# Patient Record
Sex: Male | Born: 1945 | Race: White | Hispanic: No | Smoking: Never smoker
Health system: Southern US, Community
[De-identification: ages and names within clinical notes are randomized; demographics above are authoritative.]

## PROBLEM LIST (undated history)

## (undated) DIAGNOSIS — G4730 Sleep apnea, unspecified: Secondary | ICD-10-CM

## (undated) DIAGNOSIS — N400 Enlarged prostate without lower urinary tract symptoms: Secondary | ICD-10-CM

## (undated) DIAGNOSIS — I10 Essential (primary) hypertension: Secondary | ICD-10-CM

## (undated) DIAGNOSIS — M1990 Unspecified osteoarthritis, unspecified site: Secondary | ICD-10-CM

## (undated) DIAGNOSIS — K219 Gastro-esophageal reflux disease without esophagitis: Secondary | ICD-10-CM

## (undated) HISTORY — PX: COLONOSCOPY: SHX174

## (undated) HISTORY — DX: Gastro-esophageal reflux disease without esophagitis: K21.9

## (undated) HISTORY — DX: Sleep apnea, unspecified: G47.30

## (undated) HISTORY — PX: KNEE ARTHROSCOPY: SUR90

---

## 1999-10-01 ENCOUNTER — Ambulatory Visit (HOSPITAL_COMMUNITY): Admission: RE | Admit: 1999-10-01 | Discharge: 1999-10-01 | Payer: Self-pay | Admitting: Orthopaedic Surgery

## 2002-03-01 ENCOUNTER — Encounter: Payer: Self-pay | Admitting: Gastroenterology

## 2004-02-13 ENCOUNTER — Encounter: Payer: Self-pay | Admitting: Internal Medicine

## 2005-01-02 ENCOUNTER — Ambulatory Visit: Payer: Self-pay | Admitting: Internal Medicine

## 2005-01-03 ENCOUNTER — Ambulatory Visit: Payer: Self-pay | Admitting: Internal Medicine

## 2006-01-24 ENCOUNTER — Ambulatory Visit: Payer: Self-pay | Admitting: Internal Medicine

## 2006-02-13 ENCOUNTER — Ambulatory Visit: Payer: Self-pay | Admitting: Internal Medicine

## 2007-04-02 ENCOUNTER — Ambulatory Visit: Payer: Self-pay | Admitting: Family Medicine

## 2007-10-12 ENCOUNTER — Ambulatory Visit: Payer: Self-pay | Admitting: Internal Medicine

## 2007-10-12 LAB — CONVERTED CEMR LAB
ALT: 34 units/L (ref 0–53)
AST: 26 units/L (ref 0–37)
Albumin: 4 g/dL (ref 3.5–5.2)
Alkaline Phosphatase: 43 units/L (ref 39–117)
BUN: 22 mg/dL (ref 6–23)
Basophils Absolute: 0 10*3/uL (ref 0.0–0.1)
Basophils Relative: 0.7 % (ref 0.0–1.0)
Bilirubin Urine: NEGATIVE
Bilirubin, Direct: 0.1 mg/dL (ref 0.0–0.3)
CO2: 31 meq/L (ref 19–32)
Calcium: 9.8 mg/dL (ref 8.4–10.5)
Chloride: 104 meq/L (ref 96–112)
Cholesterol: 191 mg/dL (ref 0–200)
Creatinine, Ser: 1.4 mg/dL (ref 0.4–1.5)
Eosinophils Absolute: 0.1 10*3/uL (ref 0.0–0.6)
Eosinophils Relative: 2.2 % (ref 0.0–5.0)
GFR calc Af Amer: 66 mL/min
GFR calc non Af Amer: 55 mL/min
Glucose, Bld: 116 mg/dL — ABNORMAL HIGH (ref 70–99)
HCT: 42.1 % (ref 39.0–52.0)
HDL: 35 mg/dL — ABNORMAL LOW (ref >39.0)
Hemoglobin, Urine: NEGATIVE
Hemoglobin: 14.8 g/dL (ref 13.0–17.0)
Ketones, ur: NEGATIVE mg/dL
LDL Cholesterol: 135 mg/dL — ABNORMAL HIGH (ref 0–99)
Leukocytes, UA: NEGATIVE
Lymphocytes Relative: 21.5 % (ref 12.0–46.0)
MCHC: 35.2 g/dL (ref 30.0–36.0)
MCV: 86.8 fL (ref 78.0–100.0)
Monocytes Absolute: 0.6 10*3/uL (ref 0.2–0.7)
Monocytes Relative: 9.9 % (ref 3.0–11.0)
Neutro Abs: 4.3 10*3/uL (ref 1.4–7.7)
Neutrophils Relative %: 65.7 % (ref 43.0–77.0)
Nitrite: NEGATIVE
PSA: 0.81 ng/mL (ref 0.10–4.00)
Platelets: 213 10*3/uL (ref 150–400)
Potassium: 5.2 meq/L — ABNORMAL HIGH (ref 3.5–5.1)
RBC: 4.85 M/uL (ref 4.22–5.81)
RDW: 12.4 % (ref 11.5–14.6)
Sodium: 140 meq/L (ref 135–145)
Specific Gravity, Urine: 1.015 (ref 1.000–1.03)
TSH: 2.82 microintl units/mL (ref 0.35–5.50)
Total Bilirubin: 0.8 mg/dL (ref 0.3–1.2)
Total CHOL/HDL Ratio: 5.5
Total Protein, Urine: NEGATIVE mg/dL
Total Protein: 6.6 g/dL (ref 6.0–8.3)
Triglycerides: 107 mg/dL (ref 0–149)
Urine Glucose: NEGATIVE mg/dL
Urobilinogen, UA: 0.2 (ref 0.0–1.0)
VLDL: 21 mg/dL (ref 0–40)
WBC: 6.4 10*3/uL (ref 4.5–10.5)
pH: 6.5 (ref 5.0–8.0)

## 2007-10-22 ENCOUNTER — Ambulatory Visit: Payer: Self-pay | Admitting: Internal Medicine

## 2007-10-26 ENCOUNTER — Ambulatory Visit: Payer: Self-pay | Admitting: Internal Medicine

## 2008-01-11 ENCOUNTER — Ambulatory Visit: Payer: Self-pay | Admitting: Internal Medicine

## 2008-01-22 ENCOUNTER — Ambulatory Visit: Payer: Self-pay | Admitting: Internal Medicine

## 2008-01-22 DIAGNOSIS — E785 Hyperlipidemia, unspecified: Secondary | ICD-10-CM | POA: Insufficient documentation

## 2008-01-22 DIAGNOSIS — R7301 Impaired fasting glucose: Secondary | ICD-10-CM | POA: Insufficient documentation

## 2008-01-22 LAB — CONVERTED CEMR LAB
AST: 22 units/L (ref 0–37)
Albumin: 4.2 g/dL (ref 3.5–5.2)
Calcium: 10.1 mg/dL (ref 8.4–10.5)
Chloride: 103 meq/L (ref 96–112)
GFR calc Af Amer: 66 mL/min
GFR calc non Af Amer: 55 mL/min
HDL: 34.5 mg/dL — ABNORMAL LOW (ref >39.0)
Hgb A1c MFr Bld: 5.6 % (ref 4.6–6.0)
LDL Cholesterol: 131 mg/dL — ABNORMAL HIGH (ref 0–99)
Sodium: 139 meq/L (ref 135–145)
Total CHOL/HDL Ratio: 5.6
VLDL: 27 mg/dL (ref 0–40)

## 2008-01-29 ENCOUNTER — Ambulatory Visit: Payer: Self-pay | Admitting: Internal Medicine

## 2008-02-23 ENCOUNTER — Ambulatory Visit: Payer: Self-pay | Admitting: Gastroenterology

## 2008-02-29 ENCOUNTER — Ambulatory Visit: Payer: Self-pay | Admitting: Internal Medicine

## 2008-03-02 ENCOUNTER — Encounter: Payer: Self-pay | Admitting: Internal Medicine

## 2008-03-02 ENCOUNTER — Ambulatory Visit: Payer: Self-pay | Admitting: Gastroenterology

## 2008-03-02 LAB — HM COLONOSCOPY: HM Colonoscopy: NORMAL

## 2008-03-08 LAB — CONVERTED CEMR LAB
Basophils Relative: 0.8 % (ref 0.0–1.0)
Eosinophils Relative: 2.3 % (ref 0.0–5.0)
Ferritin: 195.6 ng/mL (ref 22.0–322.0)
HCT: 42.3 % (ref 39.0–52.0)
Hemoglobin: 14.5 g/dL (ref 13.0–17.0)
Lymphocytes Relative: 19.9 % (ref 12.0–46.0)
Monocytes Absolute: 0.7 10*3/uL (ref 0.1–1.0)
Monocytes Relative: 8.4 % (ref 3.0–12.0)
Neutro Abs: 5.5 10*3/uL (ref 1.4–7.7)
RBC: 4.73 M/uL (ref 4.22–5.81)
RDW: 12.7 % (ref 11.5–14.6)
Saturation Ratios: 17 % — ABNORMAL LOW (ref 20.0–50.0)
Transferrin: 235.8 mg/dL (ref >=212.0)
Vitamin B-12: 668 pg/mL (ref 211–911)

## 2008-06-20 ENCOUNTER — Telehealth: Payer: Self-pay | Admitting: Internal Medicine

## 2009-09-01 ENCOUNTER — Ambulatory Visit: Payer: Self-pay | Admitting: Family Medicine

## 2009-12-12 ENCOUNTER — Encounter: Payer: Self-pay | Admitting: Internal Medicine

## 2010-09-25 ENCOUNTER — Ambulatory Visit: Payer: Self-pay | Admitting: Internal Medicine

## 2010-10-05 ENCOUNTER — Ambulatory Visit: Payer: Self-pay | Admitting: Internal Medicine

## 2010-10-12 ENCOUNTER — Ambulatory Visit: Payer: Self-pay | Admitting: Internal Medicine

## 2010-10-12 DIAGNOSIS — N401 Enlarged prostate with lower urinary tract symptoms: Secondary | ICD-10-CM | POA: Insufficient documentation

## 2010-10-12 DIAGNOSIS — M7020 Olecranon bursitis, unspecified elbow: Secondary | ICD-10-CM | POA: Insufficient documentation

## 2010-10-12 LAB — CONVERTED CEMR LAB
Albumin: 3.3 g/dL — ABNORMAL LOW (ref 3.5–5.2)
Alkaline Phosphatase: 44 units/L (ref 39–117)
BUN: 24 mg/dL — ABNORMAL HIGH (ref 6–23)
Basophils Absolute: 0 10*3/uL (ref 0.0–0.1)
Basophils Relative: 0.7 % (ref 0.0–3.0)
Bilirubin, Direct: 0.1 mg/dL (ref 0.0–0.3)
CO2: 33 meq/L — ABNORMAL HIGH (ref 19–32)
Calcium: 9.7 mg/dL (ref 8.4–10.5)
Chloride: 102 meq/L (ref 96–112)
Cholesterol: 147 mg/dL (ref 0–200)
Creatinine, Ser: 1.2 mg/dL (ref 0.4–1.5)
Eosinophils Absolute: 0.1 10*3/uL (ref 0.0–0.7)
Glucose, Bld: 121 mg/dL — ABNORMAL HIGH (ref 70–99)
HDL: 33.8 mg/dL — ABNORMAL LOW (ref >39.00)
Hemoglobin, Urine: NEGATIVE
Lymphocytes Relative: 13.3 % (ref 12.0–46.0)
MCHC: 34.4 g/dL (ref 30.0–36.0)
MCV: 87.6 fL (ref 78.0–100.0)
Monocytes Absolute: 0.6 10*3/uL (ref 0.1–1.0)
Neutro Abs: 5.5 10*3/uL (ref 1.4–7.7)
Neutrophils Relative %: 75.5 % (ref 43.0–77.0)
Nitrite: NEGATIVE
PSA: 1.1 ng/mL (ref 0.10–4.00)
RBC: 4.47 M/uL (ref 4.22–5.81)
RDW: 13.4 % (ref 11.5–14.6)
Specific Gravity, Urine: 1.01 (ref 1.000–1.030)
Total Protein: 6.5 g/dL (ref 6.0–8.3)
Triglycerides: 58 mg/dL (ref 0.0–149.0)
Urine Glucose: NEGATIVE mg/dL
Urobilinogen, UA: 0.2 (ref 0.0–1.0)

## 2010-12-04 NOTE — Miscellaneous (Signed)
Summary: flu shot   Clinical Lists Changes  Observations: Added new observation of FLU VAX: Historical (12/09/2009 16:46)      Immunization History:  Influenza Immunization History:    Influenza:  historical (12/09/2009)

## 2010-12-04 NOTE — Assessment & Plan Note (Signed)
Summary: ?flu/njr   Vital Signs:  Patient profile:   65 year old male Weight:      216 pounds BMI:     26.21 Temp:     98.8 degrees F oral BP sitting:   134 / 78  (left arm) Cuff size:   large  Vitals Entered By: Alfred Levins, CMA (September 25, 2010 9:17 AM) CC: congestion, cough, fever x5 days   CC:  congestion, cough, and fever x5 days.  History of Present Illness: 5 day hx of flu like illness. developed while in [place]. main sxs are sinus congestion and cough tried some otc meds (VitC and aspirin), took otc nasal drops, sudafed sinus congestion is improving  Cough, has some productive cough.  Has had shaking chills, and night sweats---including last night  He thinks he is improving.   All other systems reviewed and were negative   Current Medications (verified): 1)  Glucosamine-Chondroitin 1500-1200 Mg/67ml  Liqd (Glucosamine-Chondroitin) .... 2 Once Daily 2)  Advil 200 Mg  Caps (Ibuprofen) .... Prn  Allergies (verified): No Known Drug Allergies  Physical Exam  General:  well-developed well-nourished male in no acute distress. He does appear flushed. HEENT exam atraumatic, normocephalic symmetric her muscles are intact. Left tympanic membrane is somewhat erythematous. Neck is supple without lymphadenopathy. Chest without increased work of breathing. He does have bilateral rhonchi and some dullness to percussion in the right side. Cardiac exam S1-S2 are regular. Extremities no clubbing cyanosis or edema. No rashes identified.   Impression & Recommendations:  Problem # 1:  URI (ICD-465.9) it's unclear to me whether he has a simple URI, bronchitis or early pneumonia. She's had significant fevers and chills. And his symptoms are not improving rapidly. I think it's best to treat him aggressively with antibiotic. Also given a cough medicine. Side effects of both discussed. He'll call me if his symptoms do not quickly improve. His updated medication list for this problem  includes:    Advil 200 Mg Caps (Ibuprofen) .Marland Kitchen... Prn    Hydromet 5-1.5 Mg/13ml Syrp (Hydrocodone-homatropine) .Marland Kitchen... 1 tsp by mouth three times a day  Complete Medication List: 1)  Glucosamine-chondroitin 1500-1200 Mg/79ml Liqd (Glucosamine-chondroitin) .... 2 once daily 2)  Advil 200 Mg Caps (Ibuprofen) .... Prn 3)  Doxycycline Hyclate 100 Mg Caps (Doxycycline hyclate) .... Take 1 tab twice a day 4)  Hydromet 5-1.5 Mg/13ml Syrp (Hydrocodone-homatropine) .Marland Kitchen.. 1 tsp by mouth three times a day Prescriptions: HYDROMET 5-1.5 MG/5ML SYRP (HYDROCODONE-HOMATROPINE) 1 tsp by mouth three times a day  #240 cc x 0   Entered and Authorized by:   Birdie Sons MD   Signed by:   Birdie Sons MD on 09/25/2010   Method used:   Print then Give to Patient   RxID:   [id number] DOXYCYCLINE HYCLATE 100 MG CAPS (DOXYCYCLINE HYCLATE) Take 1 tab twice a day  #20 x 0   Entered and Authorized by:   Birdie Sons MD   Signed by:   Birdie Sons MD on 09/25/2010   Method used:   Electronically to        [facility] [address] [phone]* (retail)       [address]       [place], [place]  [address]       Ph: [phone] or [phone]       Fax: [phone]   RxID:   [id number]    Orders Added: 1)  Est. Patient Level III [54270]

## 2010-12-04 NOTE — Assessment & Plan Note (Signed)
Summary: cpx/cjr   Vital Signs:  Patient profile:   65 year old male Height:      75.5 inches Weight:      216 pounds BMI:     26.74 Temp:     98.7 degrees F oral Pulse rate:   92 / minute Pulse rhythm:   regular BP sitting:   142 / 76  (left arm) Cuff size:   large  Vitals Entered By: Alfred Levins, CMA (October 12, 2010 9:05 AM) CC: cpx   CC:  cpx.  History of Present Illness: CPX   URI sxs persist---improving but now ongoing for 3 weeks.   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 He does not forget appointments, financial matters.   All other systems reviewed and were negative   Current Problems (verified): 1)  Hyperlipidemia  (ICD-272.4) 2)  Impaired Fasting Glucose  (ICD-790.21) 3)  Routine General Medical Exam@health  Care Facl  (ICD-V70.0)  Current Medications (verified): 1)  Glucosamine-Chondroitin 1500-1200 Mg/78ml  Liqd (Glucosamine-Chondroitin) .... 2 Once Daily 2)  Advil 200 Mg  Caps (Ibuprofen) .... Prn  Allergies (verified): No Known Drug Allergies  Past History:  Past Medical History: Last updated: Oct 24, 2007 Unremarkable  Past Surgical History: Last updated: 02/19/2008 left knee arthroscopy  Family History: Last updated: 2007-10-24 mother CABG-deceased 5 yo (dememtnia) father DM ministrokes 39 yo brother pancreatic ca 19  Social History: Last updated: October 24, 2007 Shx--exercises regularly builds and Chiropractor Married 2 keds-healthy Alcohol use-yes   Impression & Recommendations:  Problem # 1:  ROUTINE GENERAL MEDICAL EXAM@HEALTH  CARE FACL (ICD-V70.0) health maintenance up-to-date.  Problem # 2:  HYPERLIPIDEMIA (ICD-272.4)  Problem # 3:  IMPAIRED FASTING GLUCOSE (ICD-790.21) discussed. He does have a family history of diabetes. His BMI is elevated. I have encouraged him to lose weight. He needs  to start exercising more regularly lose weight. Labs Reviewed: Creat: 1.2 (10/05/2010)     Problem # 4:  BENIGN PROSTATIC HYPERTROPHY, WITH URINARY OBSTRUCTION (ICD-600.01)  problem. Discussed at length. He's willing to try medication. Will prescribe tamsulosin. Side effects discussed.  His updated medication list for this problem includes:    Tamsulosin Hcl 0.4 Mg Caps (Tamsulosin hcl) .Marland Kitchen... Take 1 tab by mouth at bedtime  Complete Medication List: 1)  Glucosamine-chondroitin 1500-1200 Mg/65ml Liqd (Glucosamine-chondroitin) .... 2 once daily 2)  Advil 200 Mg Caps (Ibuprofen) .... Prn 3)  Tamsulosin Hcl 0.4 Mg Caps (Tamsulosin hcl) .... Take 1 tab by mouth at bedtime 4)  Indomethacin 50 Mg Caps (Indomethacin) .... Take 1 tablet by mouth two times a day as needed gout take with food  Other Orders: T-Elbow Left 2 View (73070TC)  Patient Instructions: 1)  Please schedule a follow-up appointment in 2 months. 2)  labs one week prior to visit 3)  lipids---272.4 4)  lfts-995.2 5)  bmet-995.2 6)  A1C-250.02 7)     Prescriptions: INDOMETHACIN 50 MG CAPS (INDOMETHACIN) Take 1 tablet by mouth two times a day as needed gout take with food  #30 x 1   Entered and Authorized by:   Birdie Sons MD   Signed by:   Birdie Sons MD on 10/12/2010   Method used:   Electronically to        [facility] [address] [phone]* (retail)       [address]       [place]  [place], [place]  [address]       Ph: [phone] or [phone]       Fax: [phone]   RxID:   [id number] TAMSULOSIN HCL 0.4 MG CAPS (TAMSULOSIN HCL) Take 1 tab by mouth at bedtime  #30 x 11   Entered and Authorized by:   Birdie Sons MD   Signed by:   Birdie Sons MD on 10/12/2010   Method used:   Electronically to        [facility] [address] [phone]* (retail)       [address]       [place], [place]  [address]       Ph: [phone] or [phone]       Fax: [phone]   RxID:    [id number]    Orders Added: 1)  Est. Patient 40-64 years [99396] 2)  Est. Patient Level III [99213] 3)  T-Elbow Left 2 View [73070TC]    Preventive Care Screening  Colonoscopy:    Date:  03/02/2008    Next Due:  03/2018    Results:  normal   Last Tetanus Booster:    Date:  11/05/2007    Results:  Td   Physical Exam General Appearance: well developed, well nourished, no acute distress Eyes: conjunctiva and lids normal, PERRLA, EOMI, fundi WNL Ears, Nose, Mouth, Throat: TM clear, nares clear, oral exam WNL Neck: supple, no lymphadenopathy, no thyromegaly, no JVD Respiratory: clear to auscultation and percussion, respiratory effort normal Cardiovascular: regular rate and rhythm, S1-S2, no murmur, rub or gallop, no bruits, peripheral pulses normal and symmetric, no cyanosis, clubbing, edema or varicosities Chest: no scars, masses, tenderness; no asymmetry, skin changes,, no gynecomastia   Gastrointestinal: soft, non-tender; no hepatosplenomegaly, masses; active bowel sounds all quadrants, ; no masses, tenderness, hemorrhoids  Genitourinary: no hernia,  or prostate enlargement Lymphatic: no cervical, axillary or inguinal adenopathy Musculoskeletal: gait normal, muscle tone and strength WNL, no joint swelling, effusions, discoloration, crepitus  Skin: clear, good turgor, color WNL, no rashes, lesions, or ulcerations Neurologic: normal mental status, normal reflexes, normal strength, sensation, and motion Psychiatric: alert; oriented to person, place and time Other Exam:      Preventive Care Screening  Colonoscopy:    Date:  03/02/2008    Next Due:  03/2018    Results:  normal   Last Tetanus Booster:    Date:  11/05/2007    Results:  Td

## 2010-12-10 ENCOUNTER — Other Ambulatory Visit: Payer: Self-pay

## 2010-12-10 ENCOUNTER — Ambulatory Visit (INDEPENDENT_AMBULATORY_CARE_PROVIDER_SITE_OTHER)
Admission: RE | Admit: 2010-12-10 | Discharge: 2010-12-10 | Disposition: A | Discharge status: Home / Self Care | Payer: BC Managed Care – PPO | Source: Ambulatory Visit | Attending: Internal Medicine | Admitting: Internal Medicine

## 2010-12-10 ENCOUNTER — Other Ambulatory Visit: Payer: Self-pay | Admitting: Internal Medicine

## 2010-12-10 DIAGNOSIS — M7020 Olecranon bursitis, unspecified elbow: Secondary | ICD-10-CM

## 2010-12-10 DIAGNOSIS — E1165 Type 2 diabetes mellitus with hyperglycemia: Secondary | ICD-10-CM

## 2010-12-10 DIAGNOSIS — E785 Hyperlipidemia, unspecified: Secondary | ICD-10-CM

## 2010-12-10 DIAGNOSIS — T887XXA Unspecified adverse effect of drug or medicament, initial encounter: Secondary | ICD-10-CM

## 2010-12-10 LAB — HEPATIC FUNCTION PANEL
AST: 23 U/L (ref 0–37)
Alkaline Phosphatase: 45 U/L (ref 39–117)
Bilirubin, Direct: 0.1 mg/dL (ref 0.0–0.3)
Total Bilirubin: 0.3 mg/dL (ref 0.3–1.2)

## 2010-12-10 LAB — BASIC METABOLIC PANEL
BUN: 22 mg/dL (ref 6–23)
CO2: 32 mEq/L (ref 19–32)
Calcium: 9.7 mg/dL (ref 8.4–10.5)
Creatinine, Ser: 1.3 mg/dL (ref 0.4–1.5)
Glucose, Bld: 104 mg/dL — ABNORMAL HIGH (ref 70–99)

## 2010-12-10 LAB — LIPID PANEL: Total CHOL/HDL Ratio: 4

## 2010-12-11 NOTE — Progress Notes (Signed)
LMTCB

## 2010-12-14 ENCOUNTER — Encounter: Payer: Self-pay | Admitting: Internal Medicine

## 2010-12-17 ENCOUNTER — Ambulatory Visit (INDEPENDENT_AMBULATORY_CARE_PROVIDER_SITE_OTHER): Payer: BC Managed Care – PPO | Admitting: Internal Medicine

## 2010-12-17 ENCOUNTER — Encounter: Payer: Self-pay | Admitting: Internal Medicine

## 2010-12-17 DIAGNOSIS — E785 Hyperlipidemia, unspecified: Secondary | ICD-10-CM

## 2010-12-17 DIAGNOSIS — N401 Enlarged prostate with lower urinary tract symptoms: Secondary | ICD-10-CM

## 2010-12-17 DIAGNOSIS — R7301 Impaired fasting glucose: Secondary | ICD-10-CM

## 2010-12-17 MED ORDER — DUTASTERIDE-TAMSULOSIN HCL 0.5-0.4 MG PO CAPS: 1.0000 | ORAL_CAPSULE | Freq: Every day | ORAL | Status: DC

## 2010-12-21 NOTE — Progress Notes (Signed)
  Subjective:    Patient ID: [name], male    DOB: 1946/11/02, 65 y.o.   MRN: [id number]  HPI BPH---pt continues to have significant sxs on flomax---some better but still with nocturia and urinary urency  Lipids; not taking any medications  Hyperglycemia--no polyuria, polydipsia  No past medical history on file. Past Surgical History  Procedure Date  . Knee arthroscopy     Left    reports that he has never smoked. He does not have any smokeless tobacco history on file. He reports that he drinks alcohol. His drug history not on file. family history includes Cancer (age of onset:35) in his brother; Heart disease in his mother; and Stroke in his father.       Review of Systems  patient denies chest pain, shortness of breath, orthopnea. Denies lower extremity edema, abdominal pain, change in appetite, change in bowel movements. Patient denies rashes, musculoskeletal complaints. No other specific complaints in a complete review of systems.      Objective:   Physical Exam  well-developed well-nourished male in no acute distress. HEENT exam atraumatic, normocephalic, neck supple without jugular venous distention. Chest clear to auscultation cardiac exam S1-S2 are regular. Abdominal exam overweight with bowel sounds, soft and nontender. Extremities no edema. Neurologic exam is alert with a normal gait.        Assessment & Plan:

## 2010-12-23 ENCOUNTER — Encounter: Payer: Self-pay | Admitting: Internal Medicine

## 2010-12-23 NOTE — Assessment & Plan Note (Addendum)
Reviewed labs a1c is normal Encouraged daily exercise

## 2010-12-23 NOTE — Assessment & Plan Note (Signed)
Note HDL Advised regular exercise and low fat diet

## 2010-12-23 NOTE — Assessment & Plan Note (Signed)
sxs are not controlled Discussed alternatives Will add avodart to regimen and f//u in 6 weeks

## 2011-03-19 NOTE — Assessment & Plan Note (Signed)
[facility]                         GASTROENTEROLOGY OFFICE NOTE   NAME:[name], [name] FELICETTI                        MRN:          [id number]  DATE:02/23/2008                            DOB:          07/01/1946    [name] is a 65 year old white male referred by Dr. Cato [name] for  evaluation of rectal bleeding.   [name] has some intermittent asymptomatic rectal bleeding since  November.  [name] has regular bowel movements.  Denies abdominal and rectal  pain.  His last colonoscopy exam was fairly unremarkable as a screening  procedure in April of 2003.  [name] has no upper GI hepatobiliary complaints  and follows a regular diet and has no anorexia or weight loss.   [name] recently saw Dr. Cato [name] and had hemoccult  checked that were guaiac  negative, and CBC was normal with a metabolic profile was normal.  On  reviewing the chart, I cannot see where [name] had a CBC or iron studies.  [name] follows a regular diet and denies any food intolerances.  [name] had not  had previous upper gastrointestinal, endoscopic exams or barium studies.   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  [name] has had  arthroscopic surgery on his knees in 1972 and 2004.   MEDICATIONS:  None.   FAMILY HISTORY:  His mother had breast cancer, and his brother died of  pancreatic cancer at age 22.  There is also a history of diabetes and  atherosclerotic cardiovascular disease in his family.   SOCIAL HISTORY:  The patient is married and lives with his wife and  works as an Art gallery manager.  [name] does not smoke and uses ethanol socially.  [name]  denies problems with ethanol abuse or dependency.   REVIEW OF SYSTEMS:  Fairly unremarkable except from vague arthralgias,  headaches, and myalgias.  [name] denies any current cardiovascular or  pulmonary complaints.   REVIEW OF SYSTEMS:  Otherwise noncontributory.   PHYSICAL EXAMINATION:  [name] is a  healthy-appearing white male in no  distress.  Younger than his stated age.  [name] is 6'4 abd weighs 216  pounds.  Blood pressure is 130/82 and pulse was 72 and regular.  I could not appreciate stigmata of liver disease.  His chest was clear and [name] was in a regular rhythm without murmurs,  gallops or rubs.  There was no thyromegaly or lymphadenopathy noted.  I could not appreciate hepatosplenomegaly, abdominal masses, or  tenderness.  Bowel sounds were normal.  Peripheral extremities were unremarkable.  Mental status was normal.  The rectum was unremarkable as was the rectal exam.  There was formed  stool in the rectal vault that was guaiac negative.  I could not  appreciate any prostatic enlargement.  There was no evidence of  perirectal fissures or fistulae.   ASSESSMENT:  1. [name] has asymptomatic rectal bleeding.  I suspect [name] has      internal hemorrhoids, however we need to exclude colonic polyposis.  2. Family history of pancreatic carcinoma in his brother at age  34.   RECOMMENDATIONS:  1. High-fiber diet as tolerated.  Liberal p.o. fluids.  2. Check CBC and anemia profile.  3. Outpatient colonoscopy as convenient for the patient.     [name] Rea. Jarold Motto, MD, Caleen Essex, FAGA  Electronically Signed    DRP/MedQ  DD: 02/23/2008  DT: 02/23/2008  Job #: [id number]   cc:   [name] Mole. Swords, MD

## 2011-05-22 ENCOUNTER — Ambulatory Visit (INDEPENDENT_AMBULATORY_CARE_PROVIDER_SITE_OTHER)
Admission: RE | Admit: 2011-05-22 | Discharge: 2011-05-22 | Disposition: A | Discharge status: Home / Self Care | Payer: BC Managed Care – PPO | Source: Ambulatory Visit | Attending: Internal Medicine | Admitting: Internal Medicine

## 2011-05-22 DIAGNOSIS — R079 Chest pain, unspecified: Secondary | ICD-10-CM

## 2012-01-07 ENCOUNTER — Telehealth: Payer: Self-pay | Admitting: Internal Medicine

## 2012-01-07 MED ORDER — DUTASTERIDE-TAMSULOSIN HCL 0.5-0.4 MG PO CAPS: 1.0000 | ORAL_CAPSULE | Freq: Every day | ORAL | Status: DC

## 2012-01-07 NOTE — Telephone Encounter (Signed)
Patient called stating that he need a refill on his dutasteride-tamsulosin as he will be leaving town today. Please assist.

## 2012-01-07 NOTE — Telephone Encounter (Signed)
We printed rx last month but pt never got it.  rx sent in electronically ot [facility]

## 2012-02-05 ENCOUNTER — Other Ambulatory Visit (INDEPENDENT_AMBULATORY_CARE_PROVIDER_SITE_OTHER): Payer: BC Managed Care – PPO

## 2012-02-05 DIAGNOSIS — Z0000 Encounter for general adult medical examination without abnormal findings: Secondary | ICD-10-CM

## 2012-02-05 LAB — BASIC METABOLIC PANEL
BUN: 27 mg/dL — ABNORMAL HIGH (ref 6–23)
Calcium: 9.6 mg/dL (ref 8.4–10.5)
GFR: 50.73 mL/min — ABNORMAL LOW (ref >60.00)
Glucose, Bld: 95 mg/dL (ref 70–99)

## 2012-02-05 LAB — LIPID PANEL
Cholesterol: 179 mg/dL (ref 0–200)
HDL: 37.4 mg/dL — ABNORMAL LOW (ref >39.00)
LDL Cholesterol: 107 mg/dL — ABNORMAL HIGH (ref 0–99)
Triglycerides: 175 mg/dL — ABNORMAL HIGH (ref 0.0–149.0)

## 2012-02-05 LAB — HEPATIC FUNCTION PANEL
AST: 30 U/L (ref 0–37)
Alkaline Phosphatase: 49 U/L (ref 39–117)
Bilirubin, Direct: 0.1 mg/dL (ref 0.0–0.3)
Total Bilirubin: 0.8 mg/dL (ref 0.3–1.2)

## 2012-02-05 LAB — CBC WITH DIFFERENTIAL/PLATELET
Basophils Absolute: 0 10*3/uL (ref 0.0–0.1)
Lymphocytes Relative: 20.4 % (ref 12.0–46.0)
Monocytes Relative: 10.2 % (ref 3.0–12.0)
Platelets: 222 10*3/uL (ref 150.0–400.0)
RDW: 13.4 % (ref 11.5–14.6)

## 2012-02-05 LAB — POCT URINALYSIS DIPSTICK
Bilirubin, UA: NEGATIVE
Blood, UA: NEGATIVE
Glucose, UA: NEGATIVE
Spec Grav, UA: 1.015

## 2012-02-12 ENCOUNTER — Ambulatory Visit: Payer: BC Managed Care – PPO | Admitting: Internal Medicine

## 2012-02-24 ENCOUNTER — Encounter: Payer: Self-pay | Admitting: Internal Medicine

## 2012-02-24 ENCOUNTER — Ambulatory Visit (INDEPENDENT_AMBULATORY_CARE_PROVIDER_SITE_OTHER): Payer: BC Managed Care – PPO | Admitting: Internal Medicine

## 2012-02-24 VITALS — BP 150/68 | HR 84 | Temp 98.6°F | Ht 76.5 in | Wt 224.0 lb

## 2012-02-24 DIAGNOSIS — Z0000 Encounter for general adult medical examination without abnormal findings: Secondary | ICD-10-CM

## 2012-02-24 DIAGNOSIS — M109 Gout, unspecified: Secondary | ICD-10-CM | POA: Insufficient documentation

## 2012-02-24 MED ORDER — FINASTERIDE 5 MG PO TABS: 5.0000 mg | ORAL_TABLET | Freq: Every day | ORAL | Status: DC

## 2012-02-24 MED ORDER — TAMSULOSIN HCL 0.4 MG PO CAPS
0.4000 mg | ORAL_CAPSULE | Freq: Every day | ORAL | Status: DC
Start: 2012-02-24 — End: 2013-05-19

## 2012-02-24 NOTE — Progress Notes (Signed)
Patient ID: [name], male   DOB: [birth year], 66 y.o.   MRN: [id number]  cpx  No past medical history on file.  History   Social History  . Marital Status: Married    Spouse Name: N/A    Number of Children: N/A  . Years of Education: N/A   Occupational History  . Not on file.   Social History Main Topics  . Smoking status: Never Smoker   . Smokeless tobacco: Not on file  . Alcohol Use: Yes  . Drug Use: Not on file  . Sexually Active: Not on file   Other Topics Concern  . Not on file   Social History Narrative  . No narrative on file    Past Surgical History  Procedure Date  . Knee arthroscopy     Left    Family History  Problem Relation Age of Onset  . Heart disease Mother   . Stroke Father     mini strokes  . Cancer Brother 39    pancreatic     No Known Allergies  Current Outpatient Prescriptions on File Prior to Visit  Medication Sig Dispense Refill  . Glucosamine-Chondroitin 1500-1200 MG/30ML LIQD Take 2 oz by mouth daily.        . indomethacin (INDOCIN) 50 MG capsule Take 50 mg by mouth 2 (two) times daily with meals. As needed           patient denies chest pain, shortness of breath, orthopnea. Denies lower extremity edema, abdominal pain, change in appetite, change in bowel movements. Patient denies rashes, musculoskeletal complaints. No other specific complaints in a complete review of systems.   BP 150/68  Pulse 84  Temp(Src) 98.6 F (37 C) (Oral)  Ht 6' 4.5" (1.943 m)  Wt 224 lb (101.606 kg)  BMI 26.91 kg/m2 Well-developed male in no acute distress. HEENT exam atraumatic, normocephalic, extraocular muscles are intact. Conjunctivae are pink without exudate. Neck is supple without lymphadenopathy, thyromegaly, jugular venous distention. Chest is clear to auscultation without increased work of breathing. Cardiac exam S1-S2 are regular. The PMI is normal. No significant murmurs or gallops. Abdominal exam active bowel sounds, soft, nontender. No  abdominal bruits. Extremities no clubbing cyanosis or edema. Peripheral pulses are normal without bruits. Neurologic exam alert and oriented without any motor or sensory deficits. Rectal exam normal tone prostate normal size without masses or asymmetry.   A/P: Well Visit- health Maint UTD

## 2012-02-24 NOTE — Patient Instructions (Signed)
Monitor your blood pressure at home IF BP is consistently above 140/90 call me -- may need to treat.

## 2012-06-25 ENCOUNTER — Other Ambulatory Visit: Payer: Self-pay | Admitting: Internal Medicine

## 2012-09-10 ENCOUNTER — Telehealth: Payer: Self-pay | Admitting: Internal Medicine

## 2012-09-10 NOTE — Telephone Encounter (Signed)
Pt called and said that he was having problems with finasteride (PROSCAR) 5 MG tablet and other med for prostate. Pt having fluid retention in both feet. Pt said that Dr Cato Mulligan told pt that he would need to sch an ov with him to discuss meds and check bp. Pls advise.

## 2012-09-13 NOTE — Telephone Encounter (Signed)
Schedule OV

## 2012-11-18 ENCOUNTER — Ambulatory Visit (INDEPENDENT_AMBULATORY_CARE_PROVIDER_SITE_OTHER): Payer: BC Managed Care – PPO | Admitting: Internal Medicine

## 2012-11-18 ENCOUNTER — Encounter: Payer: Self-pay | Admitting: Internal Medicine

## 2012-11-18 VITALS — BP 150/70 | HR 88 | Temp 98.5°F | Wt 224.0 lb

## 2012-11-18 DIAGNOSIS — I10 Essential (primary) hypertension: Secondary | ICD-10-CM | POA: Insufficient documentation

## 2012-11-18 DIAGNOSIS — Z23 Encounter for immunization: Secondary | ICD-10-CM

## 2012-11-18 MED ORDER — LISINOPRIL 20 MG PO TABS: 20.0000 mg | ORAL_TABLET | Freq: Every day | ORAL | Status: DC

## 2012-11-18 NOTE — Addendum Note (Signed)
Addended by: Alfred Levins D on: 11/18/2012 08:45 AM   Modules accepted: Orders

## 2012-11-18 NOTE — Progress Notes (Signed)
Patient ID: [name], male   DOB: 1945/11/12, 67 y.o.   MRN: [id number] htn- home bps 150-165/70-85 He feels well  Reviewed pmh, psh, sochx   patient denies chest pain, shortness of breath, orthopnea. Denies lower extremity edema, abdominal pain, change in appetite, change in bowel movements. Patient denies rashes, musculoskeletal complaints. No other specific complaints in a complete review of systems.    well-developed well-nourished male in no acute distress. HEENT exam atraumatic, normocephalic, neck supple without jugular venous distention.

## 2012-11-18 NOTE — Assessment & Plan Note (Signed)
New dx Needs treatment Lisinopril 20 mg Side effects discussed

## 2012-11-19 ENCOUNTER — Encounter (HOSPITAL_BASED_OUTPATIENT_CLINIC_OR_DEPARTMENT_OTHER)
Admission: RE | Admit: 2012-11-19 | Discharge: 2012-11-19 | Disposition: A | Discharge status: Home / Self Care | Payer: Medicare Other | Source: Ambulatory Visit | Attending: Orthopedic Surgery | Admitting: Orthopedic Surgery

## 2012-11-19 ENCOUNTER — Encounter (HOSPITAL_BASED_OUTPATIENT_CLINIC_OR_DEPARTMENT_OTHER): Payer: Self-pay | Admitting: *Deleted

## 2012-11-19 ENCOUNTER — Other Ambulatory Visit: Payer: Self-pay | Admitting: Orthopedic Surgery

## 2012-11-19 LAB — BASIC METABOLIC PANEL
BUN: 26 mg/dL — ABNORMAL HIGH (ref 6–23)
Calcium: 9.6 mg/dL (ref 8.4–10.5)
GFR calc non Af Amer: 62 mL/min — ABNORMAL LOW (ref >90)
Glucose, Bld: 95 mg/dL (ref 70–99)

## 2012-11-19 NOTE — Progress Notes (Signed)
To come in for bmet-ekg-was just started on lisinopril 11/18/12 No ekg has done.

## 2012-11-20 ENCOUNTER — Ambulatory Visit (HOSPITAL_BASED_OUTPATIENT_CLINIC_OR_DEPARTMENT_OTHER): Payer: Medicare Other | Admitting: Anesthesiology

## 2012-11-20 ENCOUNTER — Encounter (HOSPITAL_BASED_OUTPATIENT_CLINIC_OR_DEPARTMENT_OTHER)
Admission: RE | Disposition: A | Discharge status: Home / Self Care | Payer: Self-pay | Source: Ambulatory Visit | Attending: Orthopedic Surgery

## 2012-11-20 ENCOUNTER — Ambulatory Visit (HOSPITAL_BASED_OUTPATIENT_CLINIC_OR_DEPARTMENT_OTHER)
Admission: RE | Admit: 2012-11-20 | Discharge: 2012-11-20 | Disposition: A | Discharge status: Home / Self Care | Payer: Medicare Other | Source: Ambulatory Visit | Attending: Orthopedic Surgery | Admitting: Orthopedic Surgery

## 2012-11-20 ENCOUNTER — Encounter (HOSPITAL_BASED_OUTPATIENT_CLINIC_OR_DEPARTMENT_OTHER): Payer: Self-pay | Admitting: Anesthesiology

## 2012-11-20 ENCOUNTER — Encounter (HOSPITAL_BASED_OUTPATIENT_CLINIC_OR_DEPARTMENT_OTHER): Payer: Self-pay | Admitting: *Deleted

## 2012-11-20 DIAGNOSIS — M898X9 Other specified disorders of bone, unspecified site: Secondary | ICD-10-CM | POA: Insufficient documentation

## 2012-11-20 DIAGNOSIS — Z01810 Encounter for preprocedural cardiovascular examination: Secondary | ICD-10-CM | POA: Insufficient documentation

## 2012-11-20 DIAGNOSIS — M129 Arthropathy, unspecified: Secondary | ICD-10-CM | POA: Insufficient documentation

## 2012-11-20 DIAGNOSIS — M7020 Olecranon bursitis, unspecified elbow: Secondary | ICD-10-CM

## 2012-11-20 DIAGNOSIS — N400 Enlarged prostate without lower urinary tract symptoms: Secondary | ICD-10-CM | POA: Insufficient documentation

## 2012-11-20 DIAGNOSIS — M24139 Other articular cartilage disorders, unspecified wrist: Secondary | ICD-10-CM | POA: Insufficient documentation

## 2012-11-20 DIAGNOSIS — I10 Essential (primary) hypertension: Secondary | ICD-10-CM | POA: Insufficient documentation

## 2012-11-20 DIAGNOSIS — Z01812 Encounter for preprocedural laboratory examination: Secondary | ICD-10-CM | POA: Insufficient documentation

## 2012-11-20 DIAGNOSIS — M109 Gout, unspecified: Secondary | ICD-10-CM | POA: Insufficient documentation

## 2012-11-20 HISTORY — DX: Essential (primary) hypertension: I10

## 2012-11-20 HISTORY — PX: OLECRANON BURSECTOMY: SHX2097

## 2012-11-20 HISTORY — DX: Unspecified osteoarthritis, unspecified site: M19.90

## 2012-11-20 HISTORY — PX: WRIST ARTHROSCOPY: SHX838

## 2012-11-20 HISTORY — PX: MASS EXCISION: SHX2000

## 2012-11-20 HISTORY — DX: Benign prostatic hyperplasia without lower urinary tract symptoms: N40.0

## 2012-11-20 LAB — POCT HEMOGLOBIN-HEMACUE: Hemoglobin: 15.4 g/dL (ref 13.0–17.0)

## 2012-11-20 SURGERY — ARTHROSCOPY, WRIST
Anesthesia: General | Site: Wrist | Laterality: Right | Wound class: Clean

## 2012-11-20 MED ORDER — FENTANYL CITRATE 0.05 MG/ML IJ SOLN
INTRAMUSCULAR | Status: DC | PRN
  Administered 2012-11-20: 100 ug via INTRAVENOUS
  Administered 2012-11-20 (×2): 50 ug via INTRAVENOUS

## 2012-11-20 MED ORDER — PROPOFOL 10 MG/ML IV BOLUS
INTRAVENOUS | Status: DC | PRN
  Administered 2012-11-20: 300 mg via INTRAVENOUS

## 2012-11-20 MED ORDER — OXYCODONE HCL 5 MG/5ML PO SOLN: 5.0000 mg | Freq: Once | ORAL | Status: DC | PRN

## 2012-11-20 MED ORDER — LACTATED RINGERS IV SOLN
INTRAVENOUS | Status: DC
  Administered 2012-11-20 (×2): via INTRAVENOUS

## 2012-11-20 MED ORDER — KETOROLAC TROMETHAMINE 30 MG/ML IJ SOLN
INTRAMUSCULAR | Status: DC | PRN
  Administered 2012-11-20: 30 mg via INTRAVENOUS

## 2012-11-20 MED ORDER — HYDROMORPHONE HCL PF 1 MG/ML IJ SOLN: 0.2500 mg | INTRAMUSCULAR | Status: DC | PRN

## 2012-11-20 MED ORDER — MIDAZOLAM HCL 5 MG/5ML IJ SOLN
INTRAMUSCULAR | Status: DC | PRN
  Administered 2012-11-20: 2 mg via INTRAVENOUS

## 2012-11-20 MED ORDER — OXYCODONE HCL 5 MG PO TABS: 5.0000 mg | ORAL_TABLET | Freq: Once | ORAL | Status: DC | PRN

## 2012-11-20 MED ORDER — OXYCODONE-ACETAMINOPHEN 5-325 MG PO TABS: ORAL_TABLET | ORAL | Status: DC

## 2012-11-20 MED ORDER — CEFAZOLIN SODIUM-DEXTROSE 2-3 GM-% IV SOLR
2.0000 g | INTRAVENOUS | Status: AC
  Administered 2012-11-20: 2 g via INTRAVENOUS

## 2012-11-20 MED ORDER — LIDOCAINE HCL (CARDIAC) 20 MG/ML IV SOLN
INTRAVENOUS | Status: DC | PRN
  Administered 2012-11-20: 100 mg via INTRAVENOUS

## 2012-11-20 MED ORDER — ONDANSETRON HCL 4 MG/2ML IJ SOLN: 4.0000 mg | Freq: Four times a day (QID) | INTRAMUSCULAR | Status: DC | PRN

## 2012-11-20 MED ORDER — BUPIVACAINE HCL (PF) 0.5 % IJ SOLN
INTRAMUSCULAR | Status: DC | PRN
  Administered 2012-11-20: 10 mL

## 2012-11-20 MED ORDER — DEXAMETHASONE SODIUM PHOSPHATE 4 MG/ML IJ SOLN
INTRAMUSCULAR | Status: DC | PRN
  Administered 2012-11-20: 10 mg via INTRAVENOUS

## 2012-11-20 MED ORDER — POVIDONE-IODINE 7.5 % EX SOLN: Freq: Once | CUTANEOUS | Status: DC

## 2012-11-20 SURGICAL SUPPLY — 93 items
APL SKNCLS STERI-STRIP NONHPOA (GAUZE/BANDAGES/DRESSINGS)
BANDAGE ELASTIC 3 VELCRO ST LF (GAUZE/BANDAGES/DRESSINGS) ×2 IMPLANT
BANDAGE ELASTIC 4 VELCRO ST LF (GAUZE/BANDAGES/DRESSINGS) ×5 IMPLANT
BENZOIN TINCTURE PRP APPL 2/3 (GAUZE/BANDAGES/DRESSINGS) ×3 IMPLANT
BLADE SURG 11 STRL SS (BLADE) ×4 IMPLANT
BLADE SURG 15 STRL LF DISP TIS (BLADE) ×3 IMPLANT
BLADE SURG 15 STRL SS (BLADE) ×4
BNDG CMPR 9X4 STRL LF SNTH (GAUZE/BANDAGES/DRESSINGS) ×3
BNDG ESMARK 4X9 LF (GAUZE/BANDAGES/DRESSINGS) ×4 IMPLANT
BUR CUDA 2.9 (BURR) IMPLANT
BUR FULL RADIUS 2.9 (BURR) IMPLANT
BUR GATOR 2.9 (BURR) ×1 IMPLANT
BUR SPHERICAL 2.9 (BURR) IMPLANT
CANISTER OMNI JUG 16 LITER (MISCELLANEOUS) IMPLANT
CANISTER SUCTION 1200CC (MISCELLANEOUS) IMPLANT
CANISTER SUCTION 2500CC (MISCELLANEOUS) ×1 IMPLANT
CLOTH BEACON ORANGE TIMEOUT ST (SAFETY) ×4 IMPLANT
COVER MAYO STAND STRL (DRAPES) ×4 IMPLANT
COVER TABLE BACK 60X90 (DRAPES) ×4 IMPLANT
CUFF TOURNIQUET SINGLE 18IN (TOURNIQUET CUFF) ×2 IMPLANT
DECANTER SPIKE VIAL GLASS SM (MISCELLANEOUS) ×3 IMPLANT
DRAIN CHANNEL 7F 3/4 FLAT (WOUND CARE) ×1 IMPLANT
DRAPE EXTREMITY T 121X128X90 (DRAPE) ×4 IMPLANT
DRAPE OEC MINIVIEW 54X84 (DRAPES) ×1 IMPLANT
DRAPE SURG 17X23 STRL (DRAPES) ×5 IMPLANT
DRAPE U 20/CS (DRAPES) ×1 IMPLANT
DRAPE U-SHAPE 47X51 STRL (DRAPES) ×5 IMPLANT
DRSG EMULSION OIL 3X3 NADH (GAUZE/BANDAGES/DRESSINGS) ×3 IMPLANT
DURAPREP 26ML APPLICATOR (WOUND CARE) ×5 IMPLANT
ELECT MENISCUS 165MM 90D (ELECTRODE) IMPLANT
ELECT NDL TIP 2.8 STRL (NEEDLE) IMPLANT
ELECT NEEDLE TIP 2.8 STRL (NEEDLE) IMPLANT
ELECT REM PT RETURN 9FT ADLT (ELECTROSURGICAL) ×4
ELECT SMALL JOINT 90D BASC (ELECTRODE) IMPLANT
ELECTRODE REM PT RTRN 9FT ADLT (ELECTROSURGICAL) ×3 IMPLANT
GAUZE XEROFORM 1X8 LF (GAUZE/BANDAGES/DRESSINGS) ×5 IMPLANT
GLOVE BIO SURGEON STRL SZ 6.5 (GLOVE) ×3 IMPLANT
GLOVE BIOGEL PI IND STRL 8 (GLOVE) ×6 IMPLANT
GLOVE BIOGEL PI INDICATOR 8 (GLOVE) ×4
GLOVE ECLIPSE 7.5 STRL STRAW (GLOVE) ×10 IMPLANT
GOWN BRE IMP PREV XXLGXLNG (GOWN DISPOSABLE) ×7 IMPLANT
GOWN PREVENTION PLUS XLARGE (GOWN DISPOSABLE) ×3 IMPLANT
GOWN PREVENTION PLUS XXLARGE (GOWN DISPOSABLE) ×4 IMPLANT
IV NS IRRIG 3000ML ARTHROMATIC (IV SOLUTION) ×10 IMPLANT
KWIRE 4.0 X .045IN (WIRE) IMPLANT
LOOP VESSEL MAXI BLUE (MISCELLANEOUS) IMPLANT
NDL HYPO 25X1 1.5 SAFETY (NEEDLE) IMPLANT
NDL SAFETY ECLIPSE 18X1.5 (NEEDLE) ×3 IMPLANT
NEEDLE HYPO 18GX1.5 SHARP (NEEDLE) ×4
NEEDLE HYPO 22GX1.5 SAFETY (NEEDLE) ×4 IMPLANT
NEEDLE HYPO 25X1 1.5 SAFETY (NEEDLE) ×4 IMPLANT
NS IRRIG 1000ML POUR BTL (IV SOLUTION) ×4 IMPLANT
PACK BASIN DAY SURGERY FS (CUSTOM PROCEDURE TRAY) ×4 IMPLANT
PAD CAST 3X4 CTTN HI CHSV (CAST SUPPLIES) ×3 IMPLANT
PAD CAST 4YDX4 CTTN HI CHSV (CAST SUPPLIES) ×6 IMPLANT
PADDING CAST ABS 4INX4YD NS (CAST SUPPLIES) ×4
PADDING CAST ABS COTTON 4X4 ST (CAST SUPPLIES) ×3 IMPLANT
PADDING CAST COTTON 3X4 STRL (CAST SUPPLIES) ×4
PADDING CAST COTTON 4X4 STRL (CAST SUPPLIES) ×4
PENCIL BUTTON HOLSTER BLD 10FT (ELECTRODE) ×4 IMPLANT
SET ARTHROSCOPY TUBING (MISCELLANEOUS) ×4
SET ARTHROSCOPY TUBING LN (MISCELLANEOUS) ×3 IMPLANT
SLING ARM FOAM STRAP LRG (SOFTGOODS) ×1 IMPLANT
SLING ARM FOAM STRAP MED (SOFTGOODS) IMPLANT
SPLINT FAST PLASTER 5X30 (CAST SUPPLIES)
SPLINT FIBERGLASS 4X30 (CAST SUPPLIES) ×1 IMPLANT
SPLINT PLASTER CAST FAST 5X30 (CAST SUPPLIES) IMPLANT
SPONGE GAUZE 4X4 12PLY (GAUZE/BANDAGES/DRESSINGS) ×5 IMPLANT
STOCKINETTE 4X48 STRL (DRAPES) ×4 IMPLANT
STRIP CLOSURE SKIN 1/2X4 (GAUZE/BANDAGES/DRESSINGS) ×3 IMPLANT
SUCTION FRAZIER TIP 10 FR DISP (SUCTIONS) ×2 IMPLANT
SUT ETHILON 4 0 PS 2 18 (SUTURE) ×6 IMPLANT
SUT MNCRL AB 3-0 PS2 18 (SUTURE) IMPLANT
SUT VIC AB 0 CT1 27 (SUTURE)
SUT VIC AB 0 CT1 27XBRD ANBCTR (SUTURE) IMPLANT
SUT VIC AB 2-0 CT1 27 (SUTURE)
SUT VIC AB 2-0 CT1 TAPERPNT 27 (SUTURE) IMPLANT
SUT VIC AB 2-0 SH 27 (SUTURE)
SUT VIC AB 2-0 SH 27XBRD (SUTURE) IMPLANT
SUT VIC AB 3-0 SH 27 (SUTURE)
SUT VIC AB 3-0 SH 27X BRD (SUTURE) IMPLANT
SUT VICRYL 3-0 CR8 SH (SUTURE) IMPLANT
SYR BULB 3OZ (MISCELLANEOUS) ×4 IMPLANT
SYR CONTROL 10ML LL (SYRINGE) ×4 IMPLANT
TOWEL OR 17X24 6PK STRL BLUE (TOWEL DISPOSABLE) ×4 IMPLANT
TOWEL OR NON WOVEN STRL DISP B (DISPOSABLE) ×4 IMPLANT
TRAP DIGIT (INSTRUMENTS) IMPLANT
TRAP FINGER LRG (INSTRUMENTS) ×2 IMPLANT
TUBE CONNECTING 20X1/4 (TUBING) ×2 IMPLANT
UNDERPAD 30X30 INCONTINENT (UNDERPADS AND DIAPERS) ×4 IMPLANT
WAND 1.5 MICROBLATOR (SURGICAL WAND) ×1 IMPLANT
WATER STERILE IRR 1000ML POUR (IV SOLUTION) ×4 IMPLANT
YANKAUER SUCT BULB TIP NO VENT (SUCTIONS) IMPLANT

## 2012-11-20 NOTE — Anesthesia Postprocedure Evaluation (Signed)
Anesthesia Post Note  Patient: [name]  Procedure(s) Performed: Procedure(s) (LRB): ARTHROSCOPY WRIST (Right) EXCISION MASS (Right) OLECRANON BURSA (Left)  Anesthesia type: General  Patient location: PACU  Post pain: Pain level controlled and Adequate analgesia  Post assessment: Post-op Vital signs reviewed, Patient's Cardiovascular Status Stable, Respiratory Function Stable, Patent Airway and Pain level controlled  Last Vitals:  Filed Vitals:   11/20/12 1051  BP: 145/51  Pulse: 101  Temp: 36.4 C  Resp: 19    Post vital signs: Reviewed and stable  Level of consciousness: awake, alert  and oriented  Complications: No apparent anesthesia complications

## 2012-11-20 NOTE — Anesthesia Preprocedure Evaluation (Signed)
Anesthesia Evaluation  Patient identified by MRN, date of birth, ID band Patient awake    Reviewed: Allergy & Precautions, H&P , NPO status   Airway Mallampati: II  Neck ROM: full    Dental   Pulmonary          Cardiovascular hypertension,     Neuro/Psych    GI/Hepatic   Endo/Other    Renal/GU      Musculoskeletal  (+) Arthritis -,   Abdominal   Peds  Hematology   Anesthesia Other Findings   Reproductive/Obstetrics                           Anesthesia Physical Anesthesia Plan  ASA: II  Anesthesia Plan: General   Post-op Pain Management:    Induction: Intravenous  Airway Management Planned: LMA  Additional Equipment:   Intra-op Plan:   Post-operative Plan:   Informed Consent: I have reviewed the patients History and Physical, chart, labs and discussed the procedure including the risks, benefits and alternatives for the proposed anesthesia with the patient or authorized representative who has indicated his/her understanding and acceptance.     Plan Discussed with: CRNA and Surgeon  Anesthesia Plan Comments:         Anesthesia Quick Evaluation

## 2012-11-20 NOTE — H&P (Signed)
  PREOPERATIVE H&P  Chief Complaint: r and l elbow and r wrist pain HPI: [name] is a 67 y.o. male who presents for evaluation of r and l elbow and r. Wrist pain. It has been present for greater than 1 year and has been worsening. He has failed conservative measures. Pain is rated as moderate.  Past Medical History  Diagnosis Date  . Hypertension   . Arthritis   . BPH (benign prostatic hyperplasia)    Past Surgical History  Procedure Date  . Knee arthroscopy     Left  . Colonoscopy     x2   History   Social History  . Marital Status: Married    Spouse Name: N/A    Number of Children: N/A  . Years of Education: N/A   Social History Main Topics  . Smoking status: Never Smoker   . Smokeless tobacco: None  . Alcohol Use: Yes  . Drug Use: No  . Sexually Active: None   Other Topics Concern  . None   Social History Narrative  . None   Family History  Problem Relation Age of Onset  . Heart disease Mother   . Stroke Father     mini strokes  . Cancer Brother 35    pancreatic    No Known Allergies Prior to Admission medications   Medication Sig Start Date End Date Taking? Authorizing Provider  finasteride (PROSCAR) 5 MG tablet Take 1 tablet (5 mg total) by mouth daily. 02/24/12 02/23/13 Yes Bruce Rexene Edison Swords, MD  indomethacin (INDOCIN) 50 MG capsule TAKE 1 CAPSULE BY MOUTH TWICE DAILY AS NEEDED FOR GOUT. TAKE WITH FOOD 06/25/12  Yes Lindley Magnus, MD  lisinopril (PRINIVIL,ZESTRIL) 20 MG tablet Take 1 tablet (20 mg total) by mouth daily. 11/18/12  Yes Lindley Magnus, MD  Tamsulosin HCl (FLOMAX) 0.4 MG CAPS Take 1 capsule (0.4 mg total) by mouth daily. 02/24/12   Lindley Magnus, MD     Positive ROS: none  All other systems have been reviewed and were otherwise negative with the exception of those mentioned in the HPI and as above.  Physical Exam: Filed Vitals:   11/20/12 0628  BP: 116/67  Pulse: 76  Temp: 98.2 F (36.8 C)  Resp: 16    General: Alert, no acute  distress Cardiovascular: No pedal edema Respiratory: No cyanosis, no use of accessory musculature GI: No organomegaly, abdomen is soft and non-tender Skin: No lesions in the area of chief complaint Neurologic: Sensation intact distally Psychiatric: Patient is competent for consent with normal mood and affect Lymphatic: No axillary or cervical lymphadenopathy  MUSCULOSKELETAL: r and l  elbow: painful spur and soft tissue swelling and pain on rom R wrist painful rom and sig sts   Assessment/Plan: TFC TEAR, SCAPHOLUNATE LIGMENT TEAR RIGHT WRIST  ELBOW MASS RIGHT, LEFT ELBOW GOUT AND BURSA  Plan for Procedure(s): ARTHROSCOPY WRIST EXCISION MASS OLECRANON BURSectomy r and l elbows  The risks benefits and alternatives were discussed with the patient including but not limited to the risks of nonoperative treatment, versus surgical intervention including infection, bleeding, nerve injury, malunion, nonunion, hardware prominence, hardware failure, need for hardware removal, blood clots, cardiopulmonary complications, morbidity, mortality, among others, and they were willing to proceed.  Predicted outcome is good, although there will be at least a six to nine month expected recovery.  [name] L, MD 11/20/2012 7:33 AM

## 2012-11-20 NOTE — Brief Op Note (Signed)
11/20/2012  10:57 AM  PATIENT:  [name]  67 y.o. male  PRE-OPERATIVE DIAGNOSIS:  TFC TEAR, SCAPHOLUNATE LIGMENT TEAR RIGHT WRIST  ELBOW MASS RIGHT, LEFT ELBOW GOUT AND BURSA   POST-OPERATIVE DIAGNOSIS:  tfc tear, scapholunate ligament tear right wrist',right elbow gout and bursa,  left elbow gout and bursa  PROCEDURE:  Procedure(s) (LRB) with comments: ARTHROSCOPY WRIST (Right) - RIGHT WRIST ARTHROSCOPY WITH DEBRIDEMENT OF TRIANGULAR FIBROCARTLIDGE TEAR AND SCAPHOLUNATE  EXCISION MASS (Right) - excision right elbow gout and bursectomy OLECRANON BURSA (Left) - EXCISION LEFT ELBOW GOUT AND BURSECTOMY   SURGEON:  Surgeon(s) and Role:    * Harvie Junior, MD - Primary  PHYSICIAN ASSISTANT:   ASSISTANTS: bethune   ANESTHESIA:   general  EBL:  Total I/O In: 2000 [I.V.:2000] Out: -   BLOOD ADMINISTERED:none  DRAINS: Penrose drain in the r elbow and flat drain in l elbow   LOCAL MEDICATIONS USED:  MARCAINE     SPECIMEN:  No Specimen  DISPOSITION OF SPECIMEN:  N/A  COUNTS:  YES  TOURNIQUET:   Total Tourniquet Time Documented: Upper Arm (Bilateral) - 94 minutes  DICTATION: .Other Dictation: Dictation Number [id number]  PLAN OF CARE: Discharge to home after PACU  PATIENT DISPOSITION:  PACU - hemodynamically stable.   Delay start of Pharmacological VTE agent (>24hrs) due to surgical blood loss or risk of bleeding: no

## 2012-11-20 NOTE — Transfer of Care (Signed)
Immediate Anesthesia Transfer of Care Note  Patient: [name]  Procedure(s) Performed: Procedure(s) (LRB) with comments: ARTHROSCOPY WRIST (Right) - RIGHT WRIST ARTHROSCOPY WITH DEBRIDEMENT OF TRIANGULAR FIBROCARTLIDGE TEAR AND SCAPHOLUNATE  EXCISION MASS (Right) - excision right elbow gout and bursectomy OLECRANON BURSA (Left) - EXCISION LEFT ELBOW GOUT AND BURSECTOMY   Patient Location: PACU  Anesthesia Type:General  Level of Consciousness: awake, alert  and oriented  Airway & Oxygen Therapy: Patient Spontanous Breathing and Patient connected to face mask oxygen  Post-op Assessment: Report given to PACU RN and Post -op Vital signs reviewed and stable  Post vital signs: Reviewed and stable  Complications: No apparent anesthesia complications

## 2012-11-23 ENCOUNTER — Encounter (HOSPITAL_BASED_OUTPATIENT_CLINIC_OR_DEPARTMENT_OTHER): Payer: Self-pay | Admitting: Orthopedic Surgery

## 2012-11-23 NOTE — Op Note (Signed)
[name], [name]               ACCOUNT NO.:  [id number]  MEDICAL RECORD NO.:  [id number]  LOCATION:                               FACILITY:  [facility]  PHYSICIAN:  Harvie Junior, M.D.   DATE OF BIRTH:  1946/08/08  DATE OF PROCEDURE:  11/20/2012 DATE OF DISCHARGE:  11/20/2012                              OPERATIVE REPORT   PREOPERATIVE DIAGNOSES: 1. Olecranon bursitis, right elbow with large tricipital spur. 2. Scapholunate ligament tear and triangular fibrocartilage tear in     the right wrist. 3. Large inflamed bursa, left elbow, with a large tricipital spur,     left elbow.  POSTOPERATIVE DIAGNOSIS:  PROCEDURE: 1. Arthroscopic debridement, right wrist with debridement of     scapholunate ligament tear, and debridement of triangular     fibrocartilage tear. 2. Excision of olecranon bursa, right elbow. 3. Excision of tricipital spur, right elbow. 4. Excision of large olecranon bursal mass, left elbow. 5. Excision of large tricipital spur left elbow.  SURGEON:  Harvie Junior, M.D.  ASSISTANT:  Marshia Ly, PA  ANESTHESIA:  General.  BRIEF HISTORY:  [name] is a 67 year old male with a history of having had significant complaints of pain in his right elbow and wrist. He likes to play tennis, has been having difficulty doing that.  He had a long-term large mass in his left elbow which we had been following and he had been wanting to get taken off.  At any rate, the pain in his wrist had gotten to the point where he was not able to play tennis because of all grip was bothering him and because of significant complaints of pain ongoing and pain in the elbow.  We had a long talk about treatment options, but felt that debridement of the wrist was going to be appropriate with wrist arthroscopy and at the same time, he was asking could we take out these olecranon bursa and do the spur debridement.  We felt it was not unreasonable though it would be a bit of a long  operation, he understood that and after long discussion, wanted to undergo all 3 procedures.  He was brought to the operating room for these procedures.  DESCRIPTION OF PROCEDURE:  The patient was brought to the operating room.  After adequate anesthesia was obtained with general anesthetic, the patient was placed on the operating table.  The right arm was then prepped and draped in the usual sterile fashion.  Following this, a curved incision was made.  Following this, the arm was exsanguinated, blood pressure inflated to 250 mmHg.  Following this, a curved incision was made over the olecranon with a football style central skin bridge which was excised.  The olecranon bursa was identified and excised in total.  Attention was then turned to the elbow where under fluoroscopic guidance, we made a small rent in the triceps tendon, and then went down to the olecranon spur.  It was a very large spur that went both medial and laterally, and we were taking down some small portions of the tricipital insertion as we were coming onto this area of the elbow.  We debrided this large portion  of the spur out with a rongeur and took some x-rays to make sure we are getting the spurred area out.  There was a significant amount of gout within the tricipital tendon and really the portion of the tendon that we were taking down was so involved with gout and gout crystals, I do not think, it was much functional as the triceps.  Ultimately, we did take down a large portion of the spur, irrigated this area thoroughly and then closed it in layers over a Penrose drain, and put a sterile compressive dressing on it and then turned attention to the right wrist where after 15 pounds of traction was pulled on the wrist, small incisions were made for arthroscopic evaluation.  There was significant gouty deposits within the wrist, although the articular cartilage of the scaphoid and lunate looked good. The scapholunate  ligament was obviously torn.  This was debrided with a combination of a shaver and a ArthroCare wand.  Once this was completed, attention was turned over the [facility], which had a sort of a delaminating large tear.  This was debrided in front and back as well as debriding out some other areas of synovitis and significant areas of chondromalacia within the wrist.  Once this wrist was thoroughly debrided including the scapholunate ligament tear, TFC, and synovitis, the instruments were removed, and small stitches were placed in the area of the wrist and a compressive dressing where a volar splint was placed, and attention was then turned towards taking down all the drapes. Tourniquet was let down.  At this point, it was about of 90-minute tourniquet time.  Although, the exact time can be gotten from anesthesia records.  Once this was completed, attention was then turned towards turning the bed back in the other direction and then the left arm was prepped and draped in the usual sterile fashion.  Following this, an incision was made over the posterior aspect of the elbow with an elliptical portion like a football to ellipse out a large mass which was full of gouty crystals and gouty deposits.  The olecranon bursa was removed.  The triceps tendon was incised and a large spur from the triceps was removed.  Once this was completed, the wound was irrigated thoroughly and closed in layers over a flat Blake drain, which was to be left to help manage the dead space from the elbow.  Once this was completed, the wound was copiously and thoroughly lavaged and suctioned dry, and closed as outlined.  A sterile compressive dressing was applied as well as a posterior splint leaving the hand free, and the patient was taken to recovery room and noted to be in satisfactory condition. Estimated blood loss for the procedure was none, on the left side.  On the right side, there was no blood loss for either  procedures.     Harvie Junior, M.D.     Ranae Plumber  D:  11/20/2012  T:  11/21/2012  Job:  [id number]

## 2012-11-27 ENCOUNTER — Encounter: Payer: Self-pay | Admitting: Gastroenterology

## 2012-12-30 ENCOUNTER — Ambulatory Visit: Payer: Medicare Other | Admitting: Internal Medicine

## 2012-12-31 ENCOUNTER — Encounter: Payer: Self-pay | Admitting: Internal Medicine

## 2012-12-31 ENCOUNTER — Ambulatory Visit (INDEPENDENT_AMBULATORY_CARE_PROVIDER_SITE_OTHER): Payer: Medicare Other | Admitting: Internal Medicine

## 2012-12-31 VITALS — BP 136/60 | Temp 98.3°F | Wt 222.0 lb

## 2012-12-31 DIAGNOSIS — E785 Hyperlipidemia, unspecified: Secondary | ICD-10-CM

## 2012-12-31 MED ORDER — ALLOPURINOL 300 MG PO TABS: 300.0000 mg | ORAL_TABLET | Freq: Every day | ORAL | Status: DC

## 2012-12-31 NOTE — Progress Notes (Signed)
Recent surgery-- see below  11/20/12: 1. Olecranon bursitis, right elbow with large tricipital spur.  2. Scapholunate ligament tear and triangular fibrocartilage tear in  the right wrist.  3. Large inflamed bursa, left elbow, with a large tricipital spur,  left elbow. All done by dr. Luiz Blare  Complication with staph infection-- may have had gout flare (?). Now treated with cipro/rocephin He currently feels well  Past Medical History  Diagnosis Date  . Hypertension   . Arthritis   . BPH (benign prostatic hyperplasia)     History   Social History  . Marital Status: Married    Spouse Name: N/A    Number of Children: N/A  . Years of Education: N/A   Occupational History  . Not on file.   Social History Main Topics  . Smoking status: Never Smoker   . Smokeless tobacco: Not on file  . Alcohol Use: Yes  . Drug Use: No  . Sexually Active: Not on file   Other Topics Concern  . Not on file   Social History Narrative  . No narrative on file    Past Surgical History  Procedure Laterality Date  . Knee arthroscopy      Left  . Colonoscopy      x2  . Wrist arthroscopy  11/20/2012    Procedure: ARTHROSCOPY WRIST;  Surgeon: Harvie Junior, MD;  Location: [facility];  Service: Orthopedics;  Laterality: Right;  RIGHT WRIST ARTHROSCOPY WITH DEBRIDEMENT OF TRIANGULAR FIBROCARTLIDGE TEAR AND SCAPHOLUNATE   . Mass excision  11/20/2012    Procedure: EXCISION MASS;  Surgeon: Harvie Junior, MD;  Location: [facility];  Service: Orthopedics;  Laterality: Right;  excision right elbow gout and bursectomy  . Olecranon bursectomy  11/20/2012    Procedure: OLECRANON BURSA;  Surgeon: Harvie Junior, MD;  Location: [facility];  Service: Orthopedics;  Laterality: Left;  EXCISION LEFT ELBOW GOUT AND BURSECTOMY     Family History  Problem Relation Age of Onset  . Heart disease Mother   . Stroke Father     mini strokes  . Cancer Brother 7    pancreatic      No Known Allergies  Current Outpatient Prescriptions on File Prior to Visit  Medication Sig Dispense Refill  . finasteride (PROSCAR) 5 MG tablet Take 1 tablet (5 mg total) by mouth daily.  90 tablet  3  . indomethacin (INDOCIN) 50 MG capsule TAKE 1 CAPSULE BY MOUTH TWICE DAILY AS NEEDED FOR GOUT. TAKE WITH FOOD  30 capsule  0  . lisinopril (PRINIVIL,ZESTRIL) 20 MG tablet Take 1 tablet (20 mg total) by mouth daily.  90 tablet  3  . oxyCODONE-acetaminophen (PERCOCET/ROXICET) 5-325 MG per tablet 1-2 q 4-6 hrs prn pain.  40 tablet  0  . Tamsulosin HCl (FLOMAX) 0.4 MG CAPS Take 1 capsule (0.4 mg total) by mouth daily.  90 capsule  3   No current facility-administered medications on file prior to visit.     patient denies chest pain, shortness of breath, orthopnea. Denies lower extremity edema, abdominal pain, change in appetite, change in bowel movements. Patient denies rashes, musculoskeletal complaints. No other specific complaints in a complete review of systems.   BP 136/60  Temp(Src) 98.3 F (36.8 C)  Wt 222 lb (100.699 kg)  BMI 26.67 kg/m2  well-developed well-nourished male in no acute distress. HEENT exam atraumatic, normocephalic, neck supple without jugular venous distention. Chest clear to auscultation cardiac exam S1-S2 are  regular. Abdominal exam overweight with bowel sounds, soft and nontender. Extremities no edema. Neurologic exam is alert with a normal gait. - has some uric acid tophi

## 2013-01-03 NOTE — Assessment & Plan Note (Signed)
i suspect he has had recent gout flares and that the staph "infection" he has experienced is really colonization  Start allopurinol Ibuprofen for one week

## 2013-01-03 NOTE — Assessment & Plan Note (Signed)
BP Readings from Last 3 Encounters:  12/31/12 136/60  11/20/12 165/55  11/20/12 165/55   Adequate control

## 2013-01-03 NOTE — Assessment & Plan Note (Signed)
No need to recheck at this time

## 2013-05-19 ENCOUNTER — Other Ambulatory Visit: Payer: Self-pay | Admitting: Internal Medicine

## 2013-06-28 ENCOUNTER — Encounter: Payer: Self-pay | Admitting: Internal Medicine

## 2013-06-28 ENCOUNTER — Ambulatory Visit (INDEPENDENT_AMBULATORY_CARE_PROVIDER_SITE_OTHER): Payer: Medicare Other | Admitting: Internal Medicine

## 2013-06-28 VITALS — BP 118/62 | HR 76 | Temp 98.2°F | Ht 77.5 in | Wt 222.0 lb

## 2013-06-28 DIAGNOSIS — I10 Essential (primary) hypertension: Secondary | ICD-10-CM

## 2013-06-28 DIAGNOSIS — Z0000 Encounter for general adult medical examination without abnormal findings: Secondary | ICD-10-CM

## 2013-06-28 DIAGNOSIS — Z23 Encounter for immunization: Secondary | ICD-10-CM

## 2013-06-28 LAB — TSH: TSH: 2.75 u[IU]/mL (ref 0.35–5.50)

## 2013-06-28 LAB — CBC WITH DIFFERENTIAL/PLATELET
Basophils Relative: 1.1 % (ref 0.0–3.0)
Eosinophils Absolute: 0.1 10*3/uL (ref 0.0–0.7)
HCT: 41.3 % (ref 39.0–52.0)
Hemoglobin: 13.9 g/dL (ref 13.0–17.0)
Lymphocytes Relative: 23.6 % (ref 12.0–46.0)
Lymphs Abs: 1.2 10*3/uL (ref 0.7–4.0)
MCHC: 33.6 g/dL (ref 30.0–36.0)
Monocytes Relative: 22.5 % — ABNORMAL HIGH (ref 3.0–12.0)
Neutro Abs: 2.6 10*3/uL (ref 1.4–7.7)
RBC: 4.78 Mil/uL (ref 4.22–5.81)

## 2013-06-28 LAB — PSA: PSA: 0.39 ng/mL (ref 0.10–4.00)

## 2013-06-28 LAB — BASIC METABOLIC PANEL
GFR: 46.5 mL/min — ABNORMAL LOW (ref >60.00)
Glucose, Bld: 113 mg/dL — ABNORMAL HIGH (ref 70–99)
Potassium: 4.7 mEq/L (ref 3.5–5.1)
Sodium: 138 mEq/L (ref 135–145)

## 2013-06-28 LAB — LIPID PANEL
HDL: 28 mg/dL — ABNORMAL LOW (ref >39.00)
VLDL: 63.6 mg/dL — ABNORMAL HIGH (ref 0.0–40.0)

## 2013-06-28 LAB — POCT URINALYSIS DIPSTICK
Blood, UA: NEGATIVE
Nitrite, UA: NEGATIVE
Urobilinogen, UA: 0.2
pH, UA: 6

## 2013-06-28 LAB — HEPATIC FUNCTION PANEL
Bilirubin, Direct: 0.1 mg/dL (ref 0.0–0.3)
Total Protein: 6.4 g/dL (ref 6.0–8.3)

## 2013-06-28 MED ORDER — OMEPRAZOLE 20 MG PO CPDR: 20.0000 mg | DELAYED_RELEASE_CAPSULE | Freq: Every day | ORAL | Status: DC

## 2013-06-28 MED ORDER — LOSARTAN POTASSIUM 50 MG PO TABS: 50.0000 mg | ORAL_TABLET | Freq: Every day | ORAL | Status: DC

## 2013-06-28 NOTE — Patient Instructions (Addendum)
Trial of omeprazole for one month- call if chest pain persists Stop lisinopril-  Start losartan

## 2013-06-28 NOTE — Progress Notes (Signed)
cpx  Past Medical History  Diagnosis Date  . Hypertension   . Arthritis   . BPH (benign prostatic hyperplasia)     History   Social History  . Marital Status: Married    Spouse Name: N/A    Number of Children: N/A  . Years of Education: N/A   Occupational History  . Not on file.   Social History Main Topics  . Smoking status: Never Smoker   . Smokeless tobacco: Not on file  . Alcohol Use: Yes  . Drug Use: No  . Sexual Activity: Not on file   Other Topics Concern  . Not on file   Social History Narrative  . No narrative on file    Past Surgical History  Procedure Laterality Date  . Knee arthroscopy      Left  . Colonoscopy      x2  . Wrist arthroscopy  11/20/2012    Procedure: ARTHROSCOPY WRIST;  Surgeon: Harvie Junior, MD;  Location: [facility];  Service: Orthopedics;  Laterality: Right;  RIGHT WRIST ARTHROSCOPY WITH DEBRIDEMENT OF TRIANGULAR FIBROCARTLIDGE TEAR AND SCAPHOLUNATE   . Mass excision  11/20/2012    Procedure: EXCISION MASS;  Surgeon: Harvie Junior, MD;  Location: [facility];  Service: Orthopedics;  Laterality: Right;  excision right elbow gout and bursectomy  . Olecranon bursectomy  11/20/2012    Procedure: OLECRANON BURSA;  Surgeon: Harvie Junior, MD;  Location: [facility];  Service: Orthopedics;  Laterality: Left;  EXCISION LEFT ELBOW GOUT AND BURSECTOMY     Family History  Problem Relation Age of Onset  . Heart disease Mother   . Stroke Father     mini strokes  . Cancer Brother 63    pancreatic     No Known Allergies  Current Outpatient Prescriptions on File Prior to Visit  Medication Sig Dispense Refill  . finasteride (PROSCAR) 5 MG tablet TAKE 1 TABLET BY MOUTH EVERY DAY  90 tablet  0  . indomethacin (INDOCIN) 50 MG capsule TAKE 1 CAPSULE BY MOUTH TWICE DAILY AS NEEDED FOR GOUT. TAKE WITH FOOD  30 capsule  0  . lisinopril (PRINIVIL,ZESTRIL) 20 MG tablet Take 1 tablet (20 mg total) by mouth  daily.  90 tablet  3  . tamsulosin (FLOMAX) 0.4 MG CAPS TAKE 1 CAPSULE BY MOUTH EVERY DAY  90 capsule  0   No current facility-administered medications on file prior to visit.     patient denies chest pain, shortness of breath, orthopnea. Denies lower extremity edema, abdominal pain, change in appetite, change in bowel movements. Patient denies rashes, musculoskeletal complaints. No other specific complaints in a complete review of systems.  MSK- pt still has bilateral chest pain- it's there all of the time. He points to axillary areas that radiates anteriorally. Sxs improve with tennis and if he takes ibuprofen.  BP 118/62  Pulse 76  Temp(Src) 98.2 F (36.8 C) (Oral)  Ht 6' 5.5" (1.969 m)  Wt 222 lb (100.699 kg)  BMI 25.97 kg/m2 Reviewed vitals  well-developed well-nourished male in no acute distress. HEENT exam atraumatic, normocephalic, neck supple without jugular venous distention. Chest clear to auscultation cardiac exam S1-S2 are regular. Abdominal exam overweight with bowel sounds, soft and nontender. Extremities no edema. Neurologic exam is alert with a normal gait.   Well visit- health maint utd Chest pain- ? Cause- trial ppi

## 2013-07-20 ENCOUNTER — Encounter: Payer: Self-pay | Admitting: Internal Medicine

## 2013-07-29 ENCOUNTER — Other Ambulatory Visit (INDEPENDENT_AMBULATORY_CARE_PROVIDER_SITE_OTHER): Payer: Medicare Other

## 2013-07-29 DIAGNOSIS — R7301 Impaired fasting glucose: Secondary | ICD-10-CM

## 2013-07-29 LAB — CBC WITH DIFFERENTIAL/PLATELET
Basophils Absolute: 0 10*3/uL (ref 0.0–0.1)
Eosinophils Absolute: 0.1 10*3/uL (ref 0.0–0.7)
MCHC: 33.6 g/dL (ref 30.0–36.0)
MCV: 87.1 fl (ref 78.0–100.0)
Monocytes Absolute: 0.7 10*3/uL (ref 0.1–1.0)
Neutrophils Relative %: 72.2 % (ref 43.0–77.0)
Platelets: 224 10*3/uL (ref 150.0–400.0)
RDW: 13.7 % (ref 11.5–14.6)

## 2013-07-29 LAB — BASIC METABOLIC PANEL
BUN: 22 mg/dL (ref 6–23)
GFR: 53.85 mL/min — ABNORMAL LOW (ref >60.00)
Potassium: 4.4 mEq/L (ref 3.5–5.1)
Sodium: 140 mEq/L (ref 135–145)

## 2013-07-30 LAB — PATHOLOGIST SMEAR REVIEW

## 2013-08-16 ENCOUNTER — Encounter: Payer: Self-pay | Admitting: Internal Medicine

## 2013-08-18 ENCOUNTER — Other Ambulatory Visit: Payer: Medicare Other

## 2013-08-25 ENCOUNTER — Encounter: Payer: Medicare Other | Admitting: Internal Medicine

## 2013-09-06 ENCOUNTER — Other Ambulatory Visit: Payer: Self-pay | Admitting: Internal Medicine

## 2013-12-17 ENCOUNTER — Other Ambulatory Visit: Payer: Self-pay | Admitting: Internal Medicine

## 2013-12-29 ENCOUNTER — Encounter: Payer: Self-pay | Admitting: Internal Medicine

## 2013-12-29 ENCOUNTER — Ambulatory Visit: Payer: Medicare Other | Admitting: Internal Medicine

## 2013-12-29 ENCOUNTER — Ambulatory Visit (INDEPENDENT_AMBULATORY_CARE_PROVIDER_SITE_OTHER): Payer: Medicare Other | Admitting: Internal Medicine

## 2013-12-29 VITALS — BP 144/70 | Temp 98.1°F | Ht 77.5 in | Wt 222.0 lb

## 2013-12-29 DIAGNOSIS — I10 Essential (primary) hypertension: Secondary | ICD-10-CM

## 2013-12-29 NOTE — Progress Notes (Signed)
Pre visit review using our clinic review tool, if applicable. No additional management support is needed unless otherwise documented below in the visit note. 

## 2013-12-29 NOTE — Progress Notes (Addendum)
He's feeling well Still has vague chest pain-- ongoing for decades but seems to be getting some worse. Pain is not exertional..  htn- no home bps  Lipids- no meds at this time  Past Medical History  Diagnosis Date  . Hypertension   . Arthritis   . BPH (benign prostatic hyperplasia)     History   Social History  . Marital Status: Married    Spouse Name: N/A    Number of Children: N/A  . Years of Education: N/A   Occupational History  . Not on file.   Social History Main Topics  . Smoking status: Never Smoker   . Smokeless tobacco: Not on file  . Alcohol Use: Yes  . Drug Use: No  . Sexual Activity: Not on file   Other Topics Concern  . Not on file   Social History Narrative  . No narrative on file    Past Surgical History  Procedure Laterality Date  . Knee arthroscopy      Left  . Colonoscopy      x2  . Wrist arthroscopy  11/20/2012    Procedure: ARTHROSCOPY WRIST;  Surgeon: Alta Corning, MD;  Location: [place];  Service: Orthopedics;  Laterality: Right;  RIGHT WRIST ARTHROSCOPY WITH DEBRIDEMENT OF TRIANGULAR FIBROCARTLIDGE TEAR AND SCAPHOLUNATE   . Mass excision  11/20/2012    Procedure: EXCISION MASS;  Surgeon: Alta Corning, MD;  Location: [facility];  Service: Orthopedics;  Laterality: Right;  excision right elbow gout and bursectomy  . Olecranon bursectomy  11/20/2012    Procedure: OLECRANON BURSA;  Surgeon: Alta Corning, MD;  Location: [place];  Service: Orthopedics;  Laterality: Left;  EXCISION LEFT ELBOW GOUT AND BURSECTOMY     Family History  Problem Relation Age of Onset  . Heart disease Mother   . Stroke Father     mini strokes  . Cancer Brother 35    pancreatic   . Multiple myeloma Brother 68    s/p stem cell transplant    No Known Allergies  Current Outpatient Prescriptions on File Prior to Visit  Medication Sig Dispense Refill  . allopurinol (ZYLOPRIM) 300 MG tablet TAKE 1 TABLET BY MOUTH  EVERY DAY  90 tablet  0  . finasteride (PROSCAR) 5 MG tablet TAKE 1 TABLET BY MOUTH EVERY DAY  90 tablet  1  . indomethacin (INDOCIN) 50 MG capsule TAKE 1 CAPSULE BY MOUTH TWICE DAILY AS NEEDED FOR GOUT. TAKE WITH FOOD  30 capsule  0  . losartan (COZAAR) 50 MG tablet Take 1 tablet (50 mg total) by mouth daily.  90 tablet  3  . omeprazole (PRILOSEC) 20 MG capsule Take 1 capsule (20 mg total) by mouth daily.  30 capsule  3   No current facility-administered medications on file prior to visit.     patient denies chest pain, shortness of breath, orthopnea. Denies lower extremity edema, abdominal pain, change in appetite, change in bowel movements. Patient denies rashes, musculoskeletal complaints. No other specific complaints in a complete review of systems.   BP 144/70  Temp(Src) 98.1 F (36.7 C) (Oral)  Ht 6' 5.5" (1.969 m)  Wt 222 lb (100.699 kg)  BMI 25.97 kg/m2  well-developed well-nourished male in no acute distress. HEENT exam atraumatic, normocephalic, neck supple without jugular venous distention. Chest clear to auscultation cardiac exam S1-S2 are regular. Abdominal exam overweight with bowel sounds, soft and nontender. Extremities no edema. Neurologic exam is alert  with a normal gait.   Chest pain-- this is non cardiac-- no further evaluation at this time--pain has been going on for decades  Lipids-   htn- stay on cozaar

## 2014-03-14 ENCOUNTER — Other Ambulatory Visit: Payer: Self-pay | Admitting: Internal Medicine

## 2014-03-14 ENCOUNTER — Encounter: Payer: Self-pay | Admitting: Internal Medicine

## 2014-03-14 MED ORDER — FINASTERIDE 5 MG PO TABS: ORAL_TABLET | ORAL | Status: DC

## 2014-03-14 MED ORDER — ALLOPURINOL 300 MG PO TABS: ORAL_TABLET | ORAL | Status: DC

## 2014-04-14 ENCOUNTER — Encounter: Payer: Self-pay | Admitting: Internal Medicine

## 2014-04-19 MED ORDER — INDOMETHACIN 50 MG PO CAPS: ORAL_CAPSULE | ORAL | Status: DC

## 2014-04-19 MED ORDER — FEBUXOSTAT 40 MG PO TABS: 40.0000 mg | ORAL_TABLET | Freq: Every day | ORAL | Status: DC

## 2014-05-12 ENCOUNTER — Encounter: Payer: Self-pay | Admitting: Internal Medicine

## 2014-06-13 ENCOUNTER — Other Ambulatory Visit: Payer: Medicare Other

## 2014-06-21 ENCOUNTER — Encounter: Payer: Medicare Other | Admitting: Internal Medicine

## 2014-06-23 ENCOUNTER — Other Ambulatory Visit (INDEPENDENT_AMBULATORY_CARE_PROVIDER_SITE_OTHER): Payer: Medicare Other

## 2014-06-23 DIAGNOSIS — M109 Gout, unspecified: Secondary | ICD-10-CM

## 2014-06-23 LAB — URIC ACID: Uric Acid, Serum: 5.7 mg/dL (ref 4.0–7.8)

## 2014-06-30 ENCOUNTER — Encounter: Payer: Self-pay | Admitting: Family Medicine

## 2014-06-30 ENCOUNTER — Ambulatory Visit (INDEPENDENT_AMBULATORY_CARE_PROVIDER_SITE_OTHER): Payer: Medicare Other | Admitting: Family Medicine

## 2014-06-30 VITALS — BP 136/72 | HR 68 | Temp 98.1°F | Ht 76.0 in | Wt 220.0 lb

## 2014-06-30 DIAGNOSIS — Z85828 Personal history of other malignant neoplasm of skin: Secondary | ICD-10-CM | POA: Insufficient documentation

## 2014-06-30 DIAGNOSIS — Z23 Encounter for immunization: Secondary | ICD-10-CM

## 2014-06-30 DIAGNOSIS — N183 Chronic kidney disease, stage 3 unspecified: Secondary | ICD-10-CM

## 2014-06-30 DIAGNOSIS — M109 Gout, unspecified: Secondary | ICD-10-CM

## 2014-06-30 DIAGNOSIS — I10 Essential (primary) hypertension: Secondary | ICD-10-CM

## 2014-06-30 DIAGNOSIS — IMO0001 Reserved for inherently not codable concepts without codable children: Secondary | ICD-10-CM

## 2014-06-30 DIAGNOSIS — Z0000 Encounter for general adult medical examination without abnormal findings: Secondary | ICD-10-CM

## 2014-06-30 DIAGNOSIS — M1990 Unspecified osteoarthritis, unspecified site: Secondary | ICD-10-CM | POA: Insufficient documentation

## 2014-06-30 DIAGNOSIS — N401 Enlarged prostate with lower urinary tract symptoms: Secondary | ICD-10-CM

## 2014-06-30 DIAGNOSIS — E785 Hyperlipidemia, unspecified: Secondary | ICD-10-CM

## 2014-06-30 DIAGNOSIS — N138 Other obstructive and reflux uropathy: Secondary | ICD-10-CM

## 2014-06-30 DIAGNOSIS — R7301 Impaired fasting glucose: Secondary | ICD-10-CM

## 2014-06-30 DIAGNOSIS — M170 Bilateral primary osteoarthritis of knee: Secondary | ICD-10-CM

## 2014-06-30 DIAGNOSIS — IMO0002 Reserved for concepts with insufficient information to code with codable children: Secondary | ICD-10-CM

## 2014-06-30 DIAGNOSIS — M1710 Unilateral primary osteoarthritis, unspecified knee: Secondary | ICD-10-CM

## 2014-06-30 MED ORDER — FEBUXOSTAT 40 MG PO TABS: 40.0000 mg | ORAL_TABLET | Freq: Every day | ORAL | Status: DC

## 2014-06-30 MED ORDER — TRAMADOL HCL 50 MG PO TABS: 25.0000 mg | ORAL_TABLET | Freq: Every day | ORAL | Status: DC | PRN

## 2014-06-30 MED ORDER — LOSARTAN POTASSIUM 50 MG PO TABS: 50.0000 mg | ORAL_TABLET | Freq: Every day | ORAL | Status: DC

## 2014-06-30 MED ORDER — FINASTERIDE 5 MG PO TABS: ORAL_TABLET | ORAL | Status: DC

## 2014-06-30 MED ORDER — OMEPRAZOLE 20 MG PO CPDR: 20.0000 mg | DELAYED_RELEASE_CAPSULE | Freq: Every day | ORAL | Status: DC

## 2014-06-30 NOTE — Patient Instructions (Addendum)
Gout-please try to continue to minimize alcohol. Your uric acid was at a good level  Chronic kidney disease-for your arthritis, let's try occasional tramadol as a susbstitute for aleve before tennis to protect your kidneys. Your losartan also protects you  BPH-check PSA today, normal prostate exam  See you back in 6 months, Dr. Durene Cal  Health Maintenance Due  Topic Date Due  . Influenza Vaccine  06/04/2014

## 2014-06-30 NOTE — Assessment & Plan Note (Signed)
Reasonable control on uloric. Uric acid <6 at goal. Prn indomethacin. Try to avoid nsaids otherwise

## 2014-06-30 NOTE — Progress Notes (Signed)
[name] Reddish, MD Phone: [phone]  Subjective:  Patient presents today to establish care and for annual physical. Chief complaint-noted.   Gout 2-3 glasses wine per week and maybe 2-3 beers per week. Episodes of gout revolve around alcohol often (usually around 2-3 months if drinking a little more). Now drinking closer to 1 per week to help. Right PIP, bilateral elbows, right wrist.  ROS- no fevers chills. No nausea vomiting.   CKD stage III 2-3 indomethacin in last 3 months, 10 aleve per week.  ROS-no decreased urination or hematuria  BPH FLomax with minimal help. Switched to finasteride with improvement. Nocturia twice nightly.  ROS- no dysuria or penile discharge  OA-knee primarily, some DJD low back Uses Glucosamine-chondroitin and aleve as above. DiscussedTramadol prn before exercise.   Dysplipidemia rec asa 24m. Check fasting triglycerides. If still over 200, will need statin.  ROS-no shortness of breath or chest pain  The following were reviewed and entered/updated in epic: Past Medical History  Diagnosis Date  . Hypertension   . Arthritis   . BPH (benign prostatic hyperplasia)    Patient Active Problem List   Diagnosis Date Noted  . CKD (chronic kidney disease), stage III 06/30/2014    Priority: Medium  . Hypertension 11/18/2012    Priority: Medium  . Gout 02/24/2012    Priority: Medium  . BENIGN PROSTATIC HYPERTROPHY, WITH URINARY OBSTRUCTION 10/12/2010    Priority: Medium  . dyslipidemia 01/22/2008    Priority: Medium  . Impaired fasting glucose 01/22/2008    Priority: Medium  . History of skin cancer 06/30/2014    Priority: Low  . Osteoarthritis 06/30/2014    Priority: Low   Past Surgical History  Procedure Laterality Date  . Knee arthroscopy      Left  . Colonoscopy      x2  . Wrist arthroscopy  11/20/2012    Procedure: ARTHROSCOPY WRIST;  Surgeon: JAlta Corning MD;  [facility] [facility]  Service: Orthopedics;  Laterality:  Right;  RIGHT WRIST ARTHROSCOPY WITH DEBRIDEMENT OF TRIANGULAR FIBROCARTLIDGE TEAR AND SCAPHOLUNATE   . Mass excision  11/20/2012    Procedure: EXCISION MASS;  Surgeon: JAlta Corning MD;  [facility]  Service: Orthopedics;  Laterality: Right;  excision right elbow gout and bursectomy  . Olecranon bursectomy  11/20/2012    Procedure: OLECRANON BURSA;  Surgeon: JAlta Corning MD;  [place] [facility]  Service: Orthopedics;  Laterality: Left;  EXCISION LEFT ELBOW GOUT AND BURSECTOMY     Family History  Problem Relation Age of Onset  . Heart disease Mother     CABG age 67 passed 871 . Stroke Father     mini strokes 80s  . Cancer Brother 34    pancreatic , died 374 . Multiple myeloma Brother 679   s/p stem cell transplant    Medications- reviewed and updated Current Outpatient Prescriptions  Medication Sig Dispense Refill  . febuxostat (ULORIC) 40 MG tablet Take 1 tablet (40 mg total) by mouth daily.  90 tablet  3  . finasteride (PROSCAR) 5 MG tablet TAKE 1 TABLET BY MOUTH EVERY DAY  90 tablet  3  . losartan (COZAAR) 50 MG tablet Take 1 tablet (50 mg total) by mouth daily.  90 tablet  3  . indomethacin (INDOCIN) 50 MG capsule TAKE 1 CAPSULE BY MOUTH TWICE DAILY AS NEEDED FOR GOUT. TAKE WITH FOOD  60 capsule  0  . omeprazole (PRILOSEC) 20 MG  capsule Take 1 capsule (20 mg total) by mouth daily.  90 capsule  3  . traMADol (ULTRAM) 50 MG tablet Take 0.5 tablets (25 mg total) by mouth daily as needed for moderate pain (once daily before exercise (tennis) as needed (not for gout)).  30 tablet  0   No current facility-administered medications for this visit.    Allergies-reviewed and updated No Known Allergies  History   Social History  . Marital Status: Married    Spouse Name: N/A    Number of Children: N/A  . Years of Education: N/A   Social History Main Topics  . Smoking status: Never Smoker   . Smokeless tobacco: None  . Alcohol Use: Yes      Comment: 6 max per week, cutting down closer to 1 due to gout  . Drug Use: No  . Sexual Activity: None   Other Topics Concern  . None   Social History Narrative   Married 43 years in 2015. 2 kids. 4 grandkids. 2 in [place], 2 in [place].       Self employed (semi retired)-engineer      Hobbies: tennis, boating (lives 1/2 time in [place] and 1/2 time here)    ROS--See HPI   Objective: BP 136/72  Pulse 68  Temp(Src) 98.1 F (36.7 C)  Ht 6' 4"  (1.93 m)  Wt 220 lb (99.791 kg)  BMI 26.79 kg/m2 Gen: NAD, resting comfortably on table HEENT: Mucous membranes are moist. Oropharynx normal Neck: no thyromegaly CV: RRR no murmurs rubs or gallops Lungs: CTAB no crackles, wheeze, rhonchi Abdomen: soft/nontender/nondistended/normal bowel sounds.  Ext: no edema, 2+ peripheral pulses Skin: warm, dry, no rash, several AK (followe by dermatology) Neuro: grossly normal, moves all extremities, PERRLA  Assessment/Plan:  68 year old for annual physical Health Maintenance counseling: 1. Anticipatory guidance: Patient counseled regarding regular dental exams, wearing seatbelts.  2. Risk factor reduction:  Advised patient of need for regular exercise and diet rich and fruits and vegetables to reduce risk of heart attack and stroke. Encouraged patient to add daily aspirin 63m.  3. Immunizations/screenings/ancillary studies Health Maintenance Due  Topic Date Due  . Influenza Vaccine -today 06/04/2014   BENIGN PROSTATIC HYPERTROPHY, WITH URINARY OBSTRUCTION Doing well on finasteride. Continue current medication. PSA today  dyslipidemia Previously elevated triglycerides. Check lipids today, suspect will need statin.   Gout Reasonable control on uloric. Uric acid <6 at goal. Prn indomethacin. Try to avoid nsaids otherwise  CKD (chronic kidney disease), stage III Discussed minimizing nsaids. Check bmet today. Tramadol as alternative for OA before exercise.   Osteoarthritis 10 aleve per  week to help with exercise. Trial tramadol before exercise as alternative due to CKD.   Impaired fasting glucose Check a1c today. Encouraged continued exercise.     Orders Placed This Encounter  Procedures  . CBC    [place]    Standing Status: Future     Number of Occurrences:      Standing Expiration Date: 07/01/2015  . Comprehensive metabolic panel    [place]    Standing Status: Future     Number of Occurrences:      Standing Expiration Date: 07/01/2015  . Hemoglobin A1c    [place]    Standing Status: Future     Number of Occurrences:      Standing Expiration Date: 07/01/2015  . PSA    Standing Status: Future     Number of Occurrences:      Standing Expiration Date: 07/01/2015  .  TSH    [place]    Standing Status: Future     Number of Occurrences:      Standing Expiration Date: 07/01/2015  . Lipid panel    [place]    Standing Status: Future     Number of Occurrences:      Standing Expiration Date: 07/01/2015   Refills provided Meds ordered this encounter  Medications  . traMADol (ULTRAM) 50 MG tablet    Sig: Take 0.5 tablets (25 mg total) by mouth daily as needed for moderate pain (once daily before exercise (tennis) as needed (not for gout)).    Dispense:  30 tablet    Refill:  0  . febuxostat (ULORIC) 40 MG tablet    Sig: Take 1 tablet (40 mg total) by mouth daily.    Dispense:  90 tablet    Refill:  3  . finasteride (PROSCAR) 5 MG tablet    Sig: TAKE 1 TABLET BY MOUTH EVERY DAY    Dispense:  90 tablet    Refill:  3  . losartan (COZAAR) 50 MG tablet    Sig: Take 1 tablet (50 mg total) by mouth daily.    Dispense:  90 tablet    Refill:  3  . omeprazole (PRILOSEC) 20 MG capsule    Sig: Take 1 capsule (20 mg total) by mouth daily.    Dispense:  90 capsule    Refill:  3

## 2014-06-30 NOTE — Assessment & Plan Note (Signed)
Check a1c today. Encouraged continued exercise.

## 2014-06-30 NOTE — Assessment & Plan Note (Signed)
Discussed minimizing nsaids. Check bmet today. Tramadol as alternative for OA before exercise.

## 2014-06-30 NOTE — Assessment & Plan Note (Signed)
Previously elevated triglycerides. Check lipids today, suspect will need statin.

## 2014-06-30 NOTE — Assessment & Plan Note (Addendum)
Doing well on finasteride. Continue current medication. PSA today

## 2014-06-30 NOTE — Assessment & Plan Note (Signed)
10 aleve per week to help with exercise. Trial tramadol before exercise as alternative due to CKD.

## 2014-07-13 ENCOUNTER — Other Ambulatory Visit (INDEPENDENT_AMBULATORY_CARE_PROVIDER_SITE_OTHER): Payer: Medicare Other

## 2014-07-13 DIAGNOSIS — N138 Other obstructive and reflux uropathy: Secondary | ICD-10-CM

## 2014-07-13 DIAGNOSIS — IMO0001 Reserved for inherently not codable concepts without codable children: Secondary | ICD-10-CM

## 2014-07-13 DIAGNOSIS — N401 Enlarged prostate with lower urinary tract symptoms: Secondary | ICD-10-CM

## 2014-07-13 DIAGNOSIS — R7301 Impaired fasting glucose: Secondary | ICD-10-CM

## 2014-07-13 DIAGNOSIS — Z0000 Encounter for general adult medical examination without abnormal findings: Secondary | ICD-10-CM

## 2014-07-13 DIAGNOSIS — I10 Essential (primary) hypertension: Secondary | ICD-10-CM

## 2014-07-13 DIAGNOSIS — E785 Hyperlipidemia, unspecified: Secondary | ICD-10-CM

## 2014-07-13 LAB — CBC
HCT: 43.2 % (ref 39.0–52.0)
Hemoglobin: 14.5 g/dL (ref 13.0–17.0)
MCHC: 33.5 g/dL (ref 30.0–36.0)
MCV: 88.6 fl (ref 78.0–100.0)
PLATELETS: 234 10*3/uL (ref 150.0–400.0)
RBC: 4.88 Mil/uL (ref 4.22–5.81)
RDW: 13.5 % (ref 11.5–15.5)
WBC: 6.6 10*3/uL (ref 4.0–10.5)

## 2014-07-13 LAB — POCT URINALYSIS DIPSTICK
Bilirubin, UA: NEGATIVE
Blood, UA: NEGATIVE
Glucose, UA: NEGATIVE
Ketones, UA: NEGATIVE
Leukocytes, UA: NEGATIVE
NITRITE UA: NEGATIVE
PH UA: 5.5
Protein, UA: NEGATIVE
SPEC GRAV UA: 1.01
UROBILINOGEN UA: 0.2

## 2014-07-13 LAB — COMPREHENSIVE METABOLIC PANEL
ALBUMIN: 4 g/dL (ref 3.5–5.2)
ALT: 22 U/L (ref 0–53)
AST: 21 U/L (ref 0–37)
Alkaline Phosphatase: 51 U/L (ref 39–117)
BUN: 26 mg/dL — AB (ref 6–23)
CALCIUM: 9.5 mg/dL (ref 8.4–10.5)
CHLORIDE: 106 meq/L (ref 96–112)
CO2: 30 mEq/L (ref 19–32)
Creatinine, Ser: 1.3 mg/dL (ref 0.4–1.5)
GFR: 60.08 mL/min (ref >60.00)
Glucose, Bld: 99 mg/dL (ref 70–99)
POTASSIUM: 4.5 meq/L (ref 3.5–5.1)
SODIUM: 141 meq/L (ref 135–145)
TOTAL PROTEIN: 6.7 g/dL (ref 6.0–8.3)
Total Bilirubin: 0.7 mg/dL (ref 0.2–1.2)

## 2014-07-13 LAB — HEPATIC FUNCTION PANEL
ALK PHOS: 51 U/L (ref 39–117)
ALT: 22 U/L (ref 0–53)
AST: 18 U/L (ref 0–37)
Albumin: 4.1 g/dL (ref 3.5–5.2)
Bilirubin, Direct: 0.1 mg/dL (ref 0.0–0.3)
TOTAL PROTEIN: 6.7 g/dL (ref 6.0–8.3)
Total Bilirubin: 0.8 mg/dL (ref 0.2–1.2)

## 2014-07-13 LAB — LIPID PANEL
Cholesterol: 181 mg/dL (ref 0–200)
HDL: 37.6 mg/dL — ABNORMAL LOW (ref >39.00)
LDL CALC: 114 mg/dL — AB (ref 0–99)
NONHDL: 143.4
Total CHOL/HDL Ratio: 5
Triglycerides: 149 mg/dL (ref 0.0–149.0)
VLDL: 29.8 mg/dL (ref 0.0–40.0)

## 2014-07-13 LAB — HEMOGLOBIN A1C: Hgb A1c MFr Bld: 6 % (ref 4.6–6.5)

## 2014-07-13 LAB — TSH: TSH: 2.87 u[IU]/mL (ref 0.35–4.50)

## 2014-07-13 LAB — PSA: PSA: 0.24 ng/mL (ref 0.10–4.00)

## 2014-07-18 ENCOUNTER — Encounter: Payer: Self-pay | Admitting: Family Medicine

## 2014-07-18 ENCOUNTER — Other Ambulatory Visit: Payer: Self-pay | Admitting: Family Medicine

## 2014-07-18 MED ORDER — ATORVASTATIN CALCIUM 20 MG PO TABS: 20.0000 mg | ORAL_TABLET | Freq: Every day | ORAL | Status: DC

## 2014-07-18 NOTE — Telephone Encounter (Signed)
Started atorvastatin.

## 2014-08-22 ENCOUNTER — Telehealth: Payer: Self-pay | Admitting: Family Medicine

## 2014-08-22 MED ORDER — ZOLPIDEM TARTRATE 5 MG PO TABS: 5.0000 mg | ORAL_TABLET | Freq: Every evening | ORAL | Status: DC | PRN

## 2014-08-22 NOTE — Telephone Encounter (Signed)
Pt requesting call back from Dr. Erasmo LeventhalHunter's cma concerning his rx for zolpidem.

## 2014-08-22 NOTE — Telephone Encounter (Signed)
Pt states years ago he was Rx ambien/Zolpiedem only for when he flys over seas, he is leaving at noon today and realized that he dosent have any. Pt would like to know if we can call in Zolpidem for him for this trip.

## 2014-08-22 NOTE — Telephone Encounter (Signed)
Medication called in per Dr. Durene CalHunter

## 2015-03-24 ENCOUNTER — Other Ambulatory Visit: Payer: Self-pay | Admitting: Family Medicine

## 2015-03-27 NOTE — Telephone Encounter (Signed)
Yes thanks 

## 2015-03-27 NOTE — Telephone Encounter (Signed)
Ok to refill 

## 2015-05-01 ENCOUNTER — Other Ambulatory Visit: Payer: Self-pay

## 2015-07-14 ENCOUNTER — Other Ambulatory Visit: Payer: Self-pay | Admitting: Family Medicine

## 2015-07-16 ENCOUNTER — Other Ambulatory Visit: Payer: Self-pay | Admitting: Family Medicine

## 2015-07-18 ENCOUNTER — Other Ambulatory Visit: Payer: Self-pay | Admitting: Family Medicine

## 2015-10-04 ENCOUNTER — Other Ambulatory Visit: Payer: Self-pay | Admitting: Family Medicine

## 2015-10-04 NOTE — Telephone Encounter (Signed)
Yes thanks 

## 2015-10-04 NOTE — Telephone Encounter (Signed)
Refill ok? 

## 2015-10-08 ENCOUNTER — Other Ambulatory Visit: Payer: Self-pay | Admitting: Family Medicine

## 2016-02-05 ENCOUNTER — Ambulatory Visit (INDEPENDENT_AMBULATORY_CARE_PROVIDER_SITE_OTHER): Payer: Medicare Other | Admitting: Family Medicine

## 2016-02-05 ENCOUNTER — Encounter: Payer: Self-pay | Admitting: Family Medicine

## 2016-02-05 VITALS — BP 138/74 | HR 76 | Temp 98.5°F | Ht 76.0 in | Wt 217.0 lb

## 2016-02-05 DIAGNOSIS — J40 Bronchitis, not specified as acute or chronic: Secondary | ICD-10-CM | POA: Diagnosis not present

## 2016-02-05 MED ORDER — AZITHROMYCIN 250 MG PO TABS: ORAL_TABLET | ORAL | Status: DC

## 2016-02-05 NOTE — Progress Notes (Signed)
   Subjective:    Patient ID: Monica MartinezDon E Geissinger, male    DOB: 1946/08/14, 70 y.o.   MRN: [id number]  HPI Here for one week of symptoms that started as a fever, PND, HA, and ST. These symptoms improved but now he has chest congestion and is coughing up yellow sputum. On Robitussin DM   Review of Systems  Constitutional: Negative.   HENT: Negative.   Eyes: Negative.   Respiratory: Positive for cough and chest tightness. Negative for shortness of breath.        Objective:   Physical Exam  Constitutional: He is oriented to person, place, and time. He appears well-developed and well-nourished.  HENT:  Right Ear: External ear normal.  Left Ear: External ear normal.  Nose: Nose normal.  Mouth/Throat: Oropharynx is clear and moist.  Eyes: Conjunctivae are normal.  Neck: No thyromegaly present.  Cardiovascular: Normal rate, regular rhythm, normal heart sounds and intact distal pulses.   Pulmonary/Chest: Effort normal. No respiratory distress. He has no wheezes. He has no rales.  Scattered rhonchi   Lymphadenopathy:    He has no cervical adenopathy.  Neurological: He is alert and oriented to person, place, and time.          Assessment & Plan:  Bronchitis, treat with a Zpack . Nelwyn SalisburyFRY,Therman Hughlett A, MD

## 2016-02-05 NOTE — Progress Notes (Signed)
Pre visit review using our clinic review tool, if applicable. No additional management support is needed unless otherwise documented below in the visit note. 

## 2016-05-13 ENCOUNTER — Other Ambulatory Visit (INDEPENDENT_AMBULATORY_CARE_PROVIDER_SITE_OTHER): Payer: Medicare Other

## 2016-05-13 DIAGNOSIS — Z0000 Encounter for general adult medical examination without abnormal findings: Secondary | ICD-10-CM | POA: Diagnosis not present

## 2016-05-13 LAB — HEPATIC FUNCTION PANEL
ALBUMIN: 4.2 g/dL (ref 3.5–5.2)
ALK PHOS: 52 U/L (ref 39–117)
ALT: 31 U/L (ref 0–53)
AST: 21 U/L (ref 0–37)
Bilirubin, Direct: 0.1 mg/dL (ref 0.0–0.3)
TOTAL PROTEIN: 6.5 g/dL (ref 6.0–8.3)
Total Bilirubin: 0.7 mg/dL (ref 0.2–1.2)

## 2016-05-13 LAB — LIPID PANEL
CHOL/HDL RATIO: 4
Cholesterol: 146 mg/dL (ref 0–200)
HDL: 37.2 mg/dL — AB (ref >39.00)
LDL CALC: 81 mg/dL (ref 0–99)
NonHDL: 108.82
TRIGLYCERIDES: 137 mg/dL (ref 0.0–149.0)
VLDL: 27.4 mg/dL (ref 0.0–40.0)

## 2016-05-13 LAB — BASIC METABOLIC PANEL
BUN: 24 mg/dL — ABNORMAL HIGH (ref 6–23)
CO2: 28 meq/L (ref 19–32)
Calcium: 9.9 mg/dL (ref 8.4–10.5)
Chloride: 107 mEq/L (ref 96–112)
Creatinine, Ser: 1.29 mg/dL (ref 0.40–1.50)
GFR: 58.69 mL/min — ABNORMAL LOW (ref >60.00)
GLUCOSE: 102 mg/dL — AB (ref 70–99)
POTASSIUM: 4.4 meq/L (ref 3.5–5.1)
SODIUM: 141 meq/L (ref 135–145)

## 2016-05-13 LAB — CBC WITH DIFFERENTIAL/PLATELET
BASOS PCT: 0.6 % (ref 0.0–3.0)
Basophils Absolute: 0 10*3/uL (ref 0.0–0.1)
EOS PCT: 3.1 % (ref 0.0–5.0)
Eosinophils Absolute: 0.2 10*3/uL (ref 0.0–0.7)
HCT: 44.5 % (ref 39.0–52.0)
Hemoglobin: 14.9 g/dL (ref 13.0–17.0)
LYMPHS ABS: 1.7 10*3/uL (ref 0.7–4.0)
Lymphocytes Relative: 23 % (ref 12.0–46.0)
MCHC: 33.5 g/dL (ref 30.0–36.0)
MCV: 86.6 fl (ref 78.0–100.0)
MONOS PCT: 12.3 % — AB (ref 3.0–12.0)
Monocytes Absolute: 0.9 10*3/uL (ref 0.1–1.0)
NEUTROS ABS: 4.6 10*3/uL (ref 1.4–7.7)
Neutrophils Relative %: 61 % (ref 43.0–77.0)
PLATELETS: 238 10*3/uL (ref 150.0–400.0)
RBC: 5.14 Mil/uL (ref 4.22–5.81)
RDW: 14.5 % (ref 11.5–15.5)
WBC: 7.6 10*3/uL (ref 4.0–10.5)

## 2016-05-13 LAB — POC URINALSYSI DIPSTICK (AUTOMATED)
BILIRUBIN UA: NEGATIVE
Blood, UA: NEGATIVE
GLUCOSE UA: NEGATIVE
KETONES UA: NEGATIVE
LEUKOCYTES UA: NEGATIVE
Nitrite, UA: NEGATIVE
Protein, UA: NEGATIVE
Spec Grav, UA: 1.01
Urobilinogen, UA: 0.2
pH, UA: 6

## 2016-05-13 LAB — TSH: TSH: 4.68 u[IU]/mL — ABNORMAL HIGH (ref 0.35–4.50)

## 2016-05-13 LAB — PSA: PSA: 0.72 ng/mL (ref 0.10–4.00)

## 2016-05-17 ENCOUNTER — Encounter: Payer: Medicare Other | Admitting: Family Medicine

## 2016-05-17 ENCOUNTER — Encounter: Payer: Self-pay | Admitting: Family Medicine

## 2016-05-17 ENCOUNTER — Ambulatory Visit (INDEPENDENT_AMBULATORY_CARE_PROVIDER_SITE_OTHER): Payer: Medicare Other | Admitting: Family Medicine

## 2016-05-17 VITALS — BP 118/62 | HR 81 | Temp 97.9°F | Ht 76.0 in | Wt 210.6 lb

## 2016-05-17 DIAGNOSIS — Z0000 Encounter for general adult medical examination without abnormal findings: Secondary | ICD-10-CM

## 2016-05-17 DIAGNOSIS — Z23 Encounter for immunization: Secondary | ICD-10-CM

## 2016-05-17 MED ORDER — ZOLPIDEM TARTRATE 5 MG PO TABS: 5.0000 mg | ORAL_TABLET | Freq: Every evening | ORAL | Status: DC | PRN

## 2016-05-17 NOTE — Patient Instructions (Addendum)
Prevnar 13 before you leave  No changes in medication  Goal blood pressure <140/90. Check monthly if going to see me in a year

## 2016-05-17 NOTE — Progress Notes (Signed)
Pre visit review using our clinic review tool, if applicable. No additional management support is needed unless otherwise documented below in the visit note. 

## 2016-05-17 NOTE — Progress Notes (Signed)
Phone: [phone]  Subjective:  Patient presents today for their annual physical. Chief complaint-noted.   See problem oriented charting- ROS- full  review of systems was completed and negative except for: difficulty with orgasm. No chest pain or shortness of breath. No headache or blurry vision.   The following were reviewed and entered/updated in epic: Past Medical History  Diagnosis Date  . Hypertension   . Arthritis   . BPH (benign prostatic hyperplasia)    Patient Active Problem List   Diagnosis Date Noted  . CKD (chronic kidney disease), stage III 06/30/2014    Priority: Medium  . Hypertension 11/18/2012    Priority: Medium  . Gout 02/24/2012    Priority: Medium  . BENIGN PROSTATIC HYPERTROPHY, WITH URINARY OBSTRUCTION 10/12/2010    Priority: Medium  . dyslipidemia 01/22/2008    Priority: Medium  . Impaired fasting glucose 01/22/2008    Priority: Medium  . History of skin cancer 06/30/2014    Priority: Low  . Osteoarthritis 06/30/2014    Priority: Low   Past Surgical History  Procedure Laterality Date  . Knee arthroscopy      Left  . Colonoscopy      x2  . Wrist arthroscopy  11/20/2012    Procedure: ARTHROSCOPY WRIST;  Surgeon: Alta Corning, MD;  Location: [facility];  Service: Orthopedics;  Laterality: Right;  RIGHT WRIST ARTHROSCOPY WITH DEBRIDEMENT OF TRIANGULAR FIBROCARTLIDGE TEAR AND SCAPHOLUNATE   . Mass excision  11/20/2012    Procedure: EXCISION MASS;  Surgeon: Alta Corning, MD;  Location: [facility];  Service: Orthopedics;  Laterality: Right;  excision right elbow gout and bursectomy  . Olecranon bursectomy  11/20/2012    Procedure: OLECRANON BURSA;  Surgeon: Alta Corning, MD;  Location: [facility];  Service: Orthopedics;  Laterality: Left;  EXCISION LEFT ELBOW GOUT AND BURSECTOMY     Family History  Problem Relation Age of Onset  . Heart disease Mother     CABG age 70, passed 31  . Stroke Father    mini strokes 80s  . Cancer Brother 34    pancreatic , died 4  . Multiple myeloma Brother 7    s/p stem cell transplant    Medications- reviewed and updated Current Outpatient Prescriptions  Medication Sig Dispense Refill  . atorvastatin (LIPITOR) 20 MG tablet TAKE 1 TABLET BY MOUTH EVERY DAY 90 tablet 2  . finasteride (PROSCAR) 5 MG tablet TAKE 1 TABLET BY MOUTH EVERY DAY 90 tablet 2  . losartan (COZAAR) 50 MG tablet TAKE 1 TABLET BY MOUTH EVERY DAY 90 tablet 3  . omeprazole (PRILOSEC) 20 MG capsule Take 1 capsule (20 mg total) by mouth daily. 90 capsule 3  . ULORIC 40 MG tablet TAKE 1 TABLET BY MOUTH EVERY DAY 90 tablet 3  . zolpidem (AMBIEN) 5 MG tablet Take 1 tablet (5 mg total) by mouth at bedtime as needed for sleep. 10 tablet 5   No current facility-administered medications for this visit.    Allergies-reviewed and updated No Known Allergies  Social History   Social History  . Marital Status: Married    Spouse Name: N/A  . Number of Children: N/A  . Years of Education: N/A   Social History Main Topics  . Smoking status: Never Smoker   . Smokeless tobacco: None  . Alcohol Use: 0.0 oz/week    0 Standard drinks or equivalent per week     Comment: 6 max per week, cutting down  closer to 1 due to gout  . Drug Use: No  . Sexual Activity: Not Asked   Other Topics Concern  . None   Social History Narrative   Married 43 years in 2015. 2 kids. 4 grandkids. 2 in [place], 2 in [place].       Self employed (semi retired)-engineer      Hobbies: tennis, boating (lives 1/2 time in [place] and 1/2 time here)    Objective: BP 118/62 mmHg  Pulse 81  Temp(Src) 97.9 F (36.6 C) (Oral)  Ht 6' 4"  (1.93 m)  Wt 210 lb 9.6 oz (95.528 kg)  BMI 25.65 kg/m2  SpO2 98% Gen: NAD, resting comfortably HEENT: Mucous membranes are moist. Oropharynx normal Neck: no thyromegaly CV: RRR no murmurs rubs or gallops Lungs: CTAB no crackles, wheeze, rhonchi Abdomen:  soft/nontender/nondistended/normal bowel sounds. No rebound or guarding.  Rectal: normal tone, diffusely enlarged prostate, no masses or tenderness Ext: no edema Skin: warm, dry Neuro: grossly normal, moves all extremities, PERRLA  Assessment/Plan:  70 y.o. male presenting for annual physical.  Health Maintenance counseling: 1. Anticipatory guidance: Patient counseled regarding regular dental exams, eye exams, wearing seatbelts.  2. Risk factor reduction:  Advised patient of need for regular exercise and diet rich and fruits and vegetables to reduce risk of heart attack and stroke. Tennis 3-4 x a week competitive, trying som [place]. Some treadmill. Eating some better- not eating as well after loss of wife.  3. Immunizations/screenings/ancillary studies Immunization History  Administered Date(s) Administered  . Influenza Split 11/18/2012  . Influenza Whole 12/09/2009  . Influenza,inj,Quad PF,36+ Mos 06/28/2013, 06/30/2014  . Pneumococcal Conjugate-13 05/17/2016  . Pneumococcal Polysaccharide-23 11/18/2012  . Td 11/05/2007  . Zoster 11/04/2010   Health Maintenance Due  Topic Date Due  . Hepatitis C Screening - next blood work Aug 16, 1946  . PNA vac Low Risk Adult (2 of 2 - PCV13)- advise dof prevnar 13 need today 11/18/2013   4. Prostate cancer screening- patient stopped finasteride and that is likely reason for doubling of PSA from prior values of around 0.4 in 2014 (was on medicine then). BPH without nodules or asymmetry on exam  Lab Results  Component Value Date   PSA 0.72 05/13/2016   PSA 0.24 07/13/2014   PSA 0.39 06/28/2013   5. Colon cancer screening - 2009 with 10 year repeat needed 6. Skin cancer screening- seeing dermatology every 6 months  Status of chronic or acute concerns  Hypertension- controlled on losartan 78m  Gout- denies recent flares- did have 1 flare in last week of may-more wine and seafood than normal, uloric 474m Hyperlipidemia- controlled on  atorvastatin 2075mBPH- finasteride stopped about a month ago. Difficulty with orgasm. Orgasm slightly better now off.no significant change off medicine. Drink sa lot at night so pees at night  CKD III- stable with normal urine sediment- also on arb  Tramadol for knee primarily prn  Wife died of kidney cancer Nov2016/12/16Emotionally tough on him. Declines counseling including grief counseling  Asymptomatic- repeat in 1 year unless symptomatic Lab Results  Component Value Date   TSH 4.68* 05/13/2016   Orders Placed This Encounter  Procedures  . Pneumococcal conjugate vaccine 13-valent IM    Meds ordered this encounter  Medications  . zolpidem (AMBIEN) 5 MG tablet    Sig: Take 1 tablet (5 mg total) by mouth at bedtime as needed for sleep.    Dispense:  10 tablet    Refill:  5  Return precautions advised.   Garret Reddish, MD

## 2016-05-21 ENCOUNTER — Telehealth: Payer: Self-pay | Admitting: Family Medicine

## 2016-05-21 NOTE — Telephone Encounter (Signed)
Pt would like to have the medication in which you all discussed in the office on 05/17/16.   [facility]

## 2016-05-22 NOTE — Telephone Encounter (Signed)
I am not sure what he is referencing- need more information

## 2016-05-23 NOTE — Telephone Encounter (Signed)
Patient decided he wanted to try the Finasteride you had discussed and also would like to give something like Viagra a try. Just wants a few of whatever you suggest to see how he does with it.

## 2016-05-23 NOTE — Telephone Encounter (Signed)
Called patient and he stated he would call me back in 10 minutes to discuss.

## 2016-05-24 MED ORDER — SILDENAFIL CITRATE 100 MG PO TABS: 100.0000 mg | ORAL_TABLET | Freq: Every day | ORAL | Status: DC | PRN

## 2016-05-24 MED ORDER — FINASTERIDE 5 MG PO TABS: 5.0000 mg | ORAL_TABLET | Freq: Every day | ORAL | Status: DC

## 2016-05-24 NOTE — Telephone Encounter (Signed)
Sent in finasteride- takes several months to shrink prostate so will not note immediate results.   Sent in viagra- try half pill to start and if doesn't work can use full pill for erectile dysfunction

## 2016-05-24 NOTE — Telephone Encounter (Signed)
Informed patient of Dr. Erasmo LeventhalHunter's instructions. Patient was very appreciative and understood the instructions.

## 2016-06-21 ENCOUNTER — Other Ambulatory Visit: Payer: Self-pay | Admitting: Family Medicine

## 2016-09-24 ENCOUNTER — Other Ambulatory Visit: Payer: Self-pay | Admitting: Family Medicine

## 2016-09-25 NOTE — Telephone Encounter (Signed)
Dr. Durene CalHunter do I refill the Uloric?

## 2016-09-25 NOTE — Telephone Encounter (Signed)
Yes thanks, you may refill uloric for gout

## 2017-01-07 ENCOUNTER — Other Ambulatory Visit: Payer: Self-pay

## 2017-01-07 MED ORDER — ATORVASTATIN CALCIUM 20 MG PO TABS: 20.0000 mg | ORAL_TABLET | Freq: Every day | ORAL | 2 refills | Status: DC

## 2017-01-07 MED ORDER — OMEPRAZOLE 20 MG PO CPDR: DELAYED_RELEASE_CAPSULE | ORAL | 2 refills | Status: DC

## 2017-01-07 MED ORDER — LOSARTAN POTASSIUM 50 MG PO TABS: 50.0000 mg | ORAL_TABLET | Freq: Every day | ORAL | 2 refills | Status: DC

## 2017-01-07 MED ORDER — FEBUXOSTAT 40 MG PO TABS: 40.0000 mg | ORAL_TABLET | Freq: Every day | ORAL | 2 refills | Status: DC

## 2017-01-10 ENCOUNTER — Telehealth: Payer: Self-pay

## 2017-01-10 ENCOUNTER — Encounter: Payer: Self-pay | Admitting: Family Medicine

## 2017-01-10 MED ORDER — OMEPRAZOLE 20 MG PO CPDR: DELAYED_RELEASE_CAPSULE | ORAL | 2 refills | Status: DC

## 2017-01-10 MED ORDER — LOSARTAN POTASSIUM 50 MG PO TABS: 50.0000 mg | ORAL_TABLET | Freq: Every day | ORAL | 2 refills | Status: DC

## 2017-01-10 NOTE — Telephone Encounter (Signed)
Pt is returning [name] call and [place] has moved to [place]

## 2017-01-10 NOTE — Telephone Encounter (Signed)
Called patient and left a voicemail message. I received a request from [facility].  They are requesting refills on omeprazole (PRILOSEC) 20 MG capsule and losartan (COZAAR) 50 MG tablet. It is noted in the request that the patient's address is in [place]. I need to confirm if this is an error or if patient has moved.

## 2017-01-10 NOTE — Telephone Encounter (Signed)
Living in [place]. Plans to still come here at least once a year for visit until he finds a physician in [place] that is a good fit. I will send in request

## 2017-02-04 ENCOUNTER — Other Ambulatory Visit: Payer: Self-pay

## 2017-02-04 MED ORDER — SILDENAFIL CITRATE 100 MG PO TABS: 100.0000 mg | ORAL_TABLET | Freq: Every day | ORAL | 5 refills | Status: DC | PRN

## 2017-02-15 ENCOUNTER — Encounter: Payer: Self-pay | Admitting: Family Medicine

## 2017-02-19 ENCOUNTER — Other Ambulatory Visit: Payer: Self-pay

## 2017-02-19 MED ORDER — SILDENAFIL CITRATE 100 MG PO TABS: 100.0000 mg | ORAL_TABLET | Freq: Every day | ORAL | 5 refills | Status: DC | PRN

## 2017-02-26 ENCOUNTER — Telehealth: Payer: Self-pay

## 2017-02-26 NOTE — Telephone Encounter (Signed)
PA is denied. Medicare will not cover the Sildenafil. Patient will have to pay out of pocket if he wants the medication.

## 2017-02-27 NOTE — Telephone Encounter (Signed)
Please inform patient

## 2017-02-27 NOTE — Telephone Encounter (Signed)
See below

## 2017-02-28 ENCOUNTER — Other Ambulatory Visit: Payer: Self-pay

## 2017-02-28 ENCOUNTER — Encounter: Payer: Self-pay | Admitting: Family Medicine

## 2017-02-28 MED ORDER — SILDENAFIL CITRATE 100 MG PO TABS: 100.0000 mg | ORAL_TABLET | Freq: Every day | ORAL | 5 refills | Status: DC | PRN

## 2017-02-28 NOTE — Telephone Encounter (Signed)
Pt addressed this via My Chart message. I sent in his prescription per his request to [facility]. He plans ot pay out of pocket.

## 2017-03-18 ENCOUNTER — Ambulatory Visit (INDEPENDENT_AMBULATORY_CARE_PROVIDER_SITE_OTHER): Payer: Medicare Other | Admitting: Family Medicine

## 2017-03-18 ENCOUNTER — Encounter: Payer: Self-pay | Admitting: Family Medicine

## 2017-03-18 VITALS — BP 122/58 | HR 73 | Temp 98.6°F | Ht 76.0 in | Wt 202.2 lb

## 2017-03-18 DIAGNOSIS — R0683 Snoring: Secondary | ICD-10-CM

## 2017-03-18 DIAGNOSIS — R252 Cramp and spasm: Secondary | ICD-10-CM | POA: Diagnosis not present

## 2017-03-18 DIAGNOSIS — R946 Abnormal results of thyroid function studies: Secondary | ICD-10-CM

## 2017-03-18 DIAGNOSIS — R7989 Other specified abnormal findings of blood chemistry: Secondary | ICD-10-CM

## 2017-03-18 LAB — BASIC METABOLIC PANEL
BUN: 25 mg/dL — AB (ref 6–23)
CALCIUM: 9.9 mg/dL (ref 8.4–10.5)
CO2: 31 mEq/L (ref 19–32)
Chloride: 107 mEq/L (ref 96–112)
Creatinine, Ser: 1.33 mg/dL (ref 0.40–1.50)
GFR: 56.51 mL/min — AB (ref >60.00)
GLUCOSE: 93 mg/dL (ref 70–99)
Potassium: 4.4 mEq/L (ref 3.5–5.1)
Sodium: 145 mEq/L (ref 135–145)

## 2017-03-18 LAB — MAGNESIUM: Magnesium: 2.2 mg/dL (ref 1.5–2.5)

## 2017-03-18 LAB — TSH: TSH: 2.73 u[IU]/mL (ref 0.35–4.50)

## 2017-03-18 NOTE — Patient Instructions (Signed)
Please stop by lab before you go  We will call you within a week or two about your referral to pulmonology for sleep apnea testing. If you do not hear within 3 weeks, give us a call.   See how you do with half tablet of the magnesium daily- if you dont- ok to go back to one full pill daily

## 2017-03-18 NOTE — Progress Notes (Signed)
Subjective:  [name] MartinezDon E [name] is a 71 y.o. year old very pleasant male patient who presents for/with See problem oriented charting ROS- cramping in legs at night, heavy snoring. Denies daytime sleepiness or dosing off. No ches tpain or shortness of breath   Past Medical History-  Patient Active Problem List   Diagnosis Date Noted  . CKD (chronic kidney disease), stage III 06/30/2014    Priority: Medium  . Hypertension 11/18/2012    Priority: Medium  . Gout 02/24/2012    Priority: Medium  . BENIGN PROSTATIC HYPERTROPHY, WITH URINARY OBSTRUCTION 10/12/2010    Priority: Medium  . dyslipidemia 01/22/2008    Priority: Medium  . Impaired fasting glucose 01/22/2008    Priority: Medium  . History of skin cancer 06/30/2014    Priority: Low  . Osteoarthritis 06/30/2014    Priority: Low    Medications- reviewed and updated Current Outpatient Prescriptions  Medication Sig Dispense Refill  . atorvastatin (LIPITOR) 20 MG tablet Take 1 tablet (20 mg total) by mouth daily. 90 tablet 2  . febuxostat (ULORIC) 40 MG tablet Take 1 tablet (40 mg total) by mouth daily. 90 tablet 2  . losartan (COZAAR) 50 MG tablet Take 1 tablet (50 mg total) by mouth daily. 90 tablet 2  . omeprazole (PRILOSEC) 20 MG capsule TAKE 1 CAPSULE BY MOUTH EVERY DAY 90 capsule 2  . sildenafil (VIAGRA) 100 MG tablet Take 1 tablet (100 mg total) by mouth daily as needed for erectile dysfunction. 10 tablet 5  . ULORIC 40 MG tablet TAKE 1 TABLET BY MOUTH EVERY DAY 90 tablet 3  . zolpidem (AMBIEN) 5 MG tablet Take 1 tablet (5 mg total) by mouth at bedtime as needed for sleep. 10 tablet 5  . finasteride (PROSCAR) 5 MG tablet Take 1 tablet (5 mg total) by mouth daily. (Patient not taking: Reported on 03/18/2017) 90 tablet 2   No current facility-administered medications for this visit.     Objective: BP (!) 122/58 (BP Location: Left Arm, Patient Position: Sitting, Cuff Size: Large)   Pulse 73   Temp 98.6 F (37 C) (Oral)   Ht 6'  4" (1.93 m)   Wt 202 lb 3.2 oz (91.7 kg)   SpO2 97%   BMI 24.61 kg/m  Gen: NAD, resting comfortably CV: RRR no murmurs rubs or gallops Lungs: CTAB no crackles, wheeze, rhonchi Ext: no edema Skin: warm, dry  Assessment/Plan:  Snoring - Plan: Ambulatory referral to Pulmonology S: patient complains of issues with sleep. He has been noted to snore heavily. Girlfriend has noted that he has long pauses in breathing then gasps for air. He also tends to twitch a fair amount in his sleep. No significant daytime sleepiness other than what a 20 minute nap can solve. Issues have not improved with him taking magnesium for cramps as below A/P: Patient would like to be evaluated for sleep apnea and other sleep disorders- refer to pulmonology for evaluation.   Leg cramps - Plan: Basic metabolic panel, Magnesium S: Cramps mentioned in past mainly at night- electrolytes were ok but did not check magnesium. Tried mustard, bar of soap. Quart of gatorade before tennis helps. Does not drink enough water he admits. Magnesium started- better over last week-1 pill magnesium gluconate 550mg  each am A/P: patient asks about continuing magnesium. Discussed lowest effective dose preferred with his CKD. Advised to trial half tablet each evening  Elevated TSH - Plan: TSH S: prior TSH elevation A/P: normalized on this repeat.   Orders  Placed This Encounter  Procedures  . Basic metabolic panel    [place]  . Magnesium  . TSH    [place]  . Ambulatory referral to Pulmonology    Referral Priority:   Routine    Referral Type:   Consultation    Referral Reason:   Specialty Services Required    Requested Specialty:   Pulmonary Disease    Number of Visits Requested:   1   Return precautions advised.  Tana Conch, MD

## 2017-04-22 ENCOUNTER — Encounter: Payer: Self-pay | Admitting: Family Medicine

## 2017-04-22 ENCOUNTER — Other Ambulatory Visit: Payer: Self-pay

## 2017-04-22 MED ORDER — SILDENAFIL CITRATE 100 MG PO TABS: 100.0000 mg | ORAL_TABLET | Freq: Every day | ORAL | 5 refills | Status: AC | PRN

## 2017-06-16 ENCOUNTER — Encounter: Payer: Self-pay | Admitting: Internal Medicine

## 2017-06-16 ENCOUNTER — Ambulatory Visit (INDEPENDENT_AMBULATORY_CARE_PROVIDER_SITE_OTHER): Payer: Medicare Other | Admitting: Internal Medicine

## 2017-06-16 VITALS — BP 124/64 | HR 68 | Ht 76.0 in | Wt 204.4 lb

## 2017-06-16 DIAGNOSIS — G4733 Obstructive sleep apnea (adult) (pediatric): Secondary | ICD-10-CM | POA: Diagnosis not present

## 2017-06-16 DIAGNOSIS — R0683 Snoring: Secondary | ICD-10-CM | POA: Diagnosis not present

## 2017-06-16 DIAGNOSIS — G4731 Primary central sleep apnea: Secondary | ICD-10-CM | POA: Insufficient documentation

## 2017-06-16 NOTE — Progress Notes (Signed)
06/16/17- 71 year old male for sleep evaluation. Pt referred by Dr. Rushie Chestnut due to snoring at night and gasping at night. Pt states that when he wakes up in the morning, he does feel rested. Pt has never had a sleep study performed. Medical problem list includes BPH, CKD III, HBP, osteoarthritis. Epworth score 7/24 He knows that he is snored all his life if he lies on his back, so he prefers to sleep on his sides. Girlfriend told him of snoring and witnessed apneas. Long history of nighttime muscle cramps and restless legs has been significantly improved since she began taking magnesium supplement. He feels these problems now are satisfactory ENT-history of nasal fracture with no surgery. No history of heart or lung disease. Active tennis player. Just back from trip to [place].  Prior to Admission medications   Medication Sig Start Date End Date Taking? Authorizing Provider  atorvastatin (LIPITOR) 20 MG tablet Take 1 tablet (20 mg total) by mouth daily. 01/07/17  Yes Marin Olp, MD  febuxostat (ULORIC) 40 MG tablet Take 1 tablet (40 mg total) by mouth daily. 01/07/17  Yes Marin Olp, MD  finasteride (PROSCAR) 5 MG tablet Take 1 tablet (5 mg total) by mouth daily. 05/24/16  Yes Marin Olp, MD  losartan (COZAAR) 50 MG tablet Take 1 tablet (50 mg total) by mouth daily. 01/10/17  Yes Marin Olp, MD  magnesium gluconate (MAGONATE) 500 MG tablet Take 500 mg by mouth daily.   Yes [provider]  omeprazole (PRILOSEC) 20 MG capsule TAKE 1 CAPSULE BY MOUTH EVERY DAY 01/10/17  Yes Marin Olp, MD  sildenafil (VIAGRA) 100 MG tablet Take 1 tablet (100 mg total) by mouth daily as needed for erectile dysfunction. 04/22/17  Yes Marin Olp, MD  zolpidem (AMBIEN) 5 MG tablet Take 1 tablet (5 mg total) by mouth at bedtime as needed for sleep. 05/17/16  Yes Marin Olp, MD   Past Medical History:  Diagnosis Date  . Arthritis   . BPH (benign prostatic hyperplasia)    . Hypertension   . Sleep apnea    Past Surgical History:  Procedure Laterality Date  . COLONOSCOPY     x2  . KNEE ARTHROSCOPY     Left  . MASS EXCISION  11/20/2012   Procedure: EXCISION MASS;  Surgeon: Alta Corning, MD;  Location: [facility];  Service: Orthopedics;  Laterality: Right;  excision right elbow gout and bursectomy  . OLECRANON BURSECTOMY  11/20/2012   Procedure: OLECRANON BURSA;  Surgeon: Alta Corning, MD;  Location: [facility];  Service: Orthopedics;  Laterality: Left;  EXCISION LEFT ELBOW GOUT AND BURSECTOMY   . WRIST ARTHROSCOPY  11/20/2012   Procedure: ARTHROSCOPY WRIST;  Surgeon: Alta Corning, MD;  Location: [facility];  Service: Orthopedics;  Laterality: Right;  RIGHT WRIST ARTHROSCOPY WITH DEBRIDEMENT OF TRIANGULAR FIBROCARTLIDGE TEAR AND SCAPHOLUNATE    Family History  Problem Relation Age of Onset  . Heart disease Mother        CABG age 67, passed 51  . Stroke Father        mini strokes 80s  . Diabetes Father   . Multiple myeloma Brother 60       s/p stem cell transplant  . Cancer Brother 17       pancreatic , died 63   Social History   Social History  . Marital status: Married    Spouse name: N/A  .  Number of children: N/A  . Years of education: N/A   Occupational History  . Not on file.   Social History Main Topics  . Smoking status: Never Smoker  . Smokeless tobacco: Never Used  . Alcohol use 0.0 oz/week     Comment: 6 max per week, cutting down closer to 1 due to gout  . Drug use: No  . Sexual activity: Not on file   Other Topics Concern  . Not on file   Social History Narrative   Widowed after 39-44 years of marriage (wife kidney cancer November 2016) 2 kids. 4 grandkids. 2 in [place], 2 in [place].       Self employed (semi retired)-engineer      Hobbies: tennis, boating (lives 1/2 time in [place] and 1/2 time here)   ROS-see HPI    "+" + pos Constitutional:    weight loss, night sweats,  fevers, chills, fatigue, lassitude. HEENT:    headaches, difficulty swallowing, tooth/dental problems, sore throat,       sneezing, itching, ear ache, nasal congestion, post nasal drip, snoring CV:    chest pain, orthopnea, PND, swelling in lower extremities, anasarca,                                                      dizziness, palpitations Resp:   shortness of breath with exertion or at rest.                productive cough,   non-productive cough, coughing up of blood.              change in color of mucus.  wheezing.   Skin:    rash or lesions. GI:  No-   heartburn, indigestion, abdominal pain, nausea, vomiting, diarrhea,                 change in bowel habits, loss of appetite GU: dysuria, change in color of urine, no urgency or frequency.   flank pain. MS:   joint pain, stiffness, decreased range of motion, back pain. Neuro-     nothing unusual Psych:  change in mood or affect.  depression or anxiety.   memory loss.  OBJ- Physical Exam General- Alert, Oriented, Affect-appropriate, Distress- none acute, + tall lean man Skin- rash-none, lesions- none, excoriation- none Lymphadenopathy- none Head- atraumatic            Eyes- Gross vision intact, PERRLA, conjunctivae and secretions clear            Ears- Hearing, canals-normal            Nose- Clear, no-Septal dev, mucus, polyps, erosion, perforation             Throat- Mallampati II , mucosa clear , drainage- none, tonsils- atrophic Neck- flexible , trachea midline, no stridor , thyroid nl, carotid no bruit Chest - symmetrical excursion , unlabored           Heart/CV- RRR , no murmur , no gallop  , no rub, nl s1 s2                           - JVD- none , edema- none, stasis changes- none, varices- none           Lung- clear to P&A,  wheeze- none, cough- none , dullness-none, rub- none           Chest wall-  Abd-  Br/ Gen/ Rectal- Not done, not indicated Extrem- cyanosis- none, clubbing, none, atrophy- none, strength- nl Neuro-  grossly intact to observation

## 2017-06-16 NOTE — Assessment & Plan Note (Signed)
Not clear from history and physical if he only has primary snoring, or also has obstructive sleep apnea. Appropriate education and discussion done. Basic sleep hygiene. Some treatment options for sleep apnea were reviewed. Plan-schedule sleep study. He will then call us for report.

## 2017-06-16 NOTE — Patient Instructions (Signed)
Order- schedule unattended home sleep test    Dx OSA   Please call for results about a week after the study is done

## 2017-06-30 DIAGNOSIS — G4733 Obstructive sleep apnea (adult) (pediatric): Secondary | ICD-10-CM

## 2017-07-02 ENCOUNTER — Other Ambulatory Visit: Payer: Self-pay | Admitting: *Deleted

## 2017-07-02 DIAGNOSIS — G4733 Obstructive sleep apnea (adult) (pediatric): Secondary | ICD-10-CM

## 2017-07-08 ENCOUNTER — Encounter: Payer: Self-pay | Admitting: Internal Medicine

## 2017-07-08 NOTE — Telephone Encounter (Signed)
His Home Sleep Test did show obstructive sleep apnea, in the severe range. He averaged over 37 apneas per hour, with drops in blood oxygen level. This should be treated.  Recommend- please order new DME, new CPAP, auto 5-20, mask of choice, humidifier, supplies, AirView   Dx OSA  Please make sure he has a return appointment between 31 and 90 days for insurance rules.

## 2017-07-08 NOTE — Telephone Encounter (Signed)
CY please advise- pt is requesting results of Home Sleep Test.  Thanks!

## 2017-07-09 NOTE — Telephone Encounter (Signed)
CDY: Please see email and advise if we could work her in, thanks!  I want to discuss this with Dr Maple HudsonYoung. I am out of town until the 12th. Then I planned to return to [place]. But I could delay my return if I could get a short doctor's visit the 13th, 14th or early the next week.  Please see if you can schedule me in for any time.    ----- Message -----  From: CMA Cornell Barmanherina M P  Sent: 07/08/17 3:39 PM  To: Audie Pintoon E. Petit  Subject: RE: Visit Follow-Up Question    Hello Mr. [name],     Dr. Maple HudsonYoung reviewed your sleep study and stated it did show that you do have severe obstructive sleep apnea that needs to be treated. He stated that you had on average 37 events per hour (events were you stopped breathing). Because of his, he would like to send in a prescription for a CPAP machine.     Is the CPAP machine something that you are interested in?     ----- Message -----   From: Audie Pintoon E. Deman   Sent: 07/08/2017 8:38 AM EDT    To: Waymon BudgeYOUNG,CLINTON D, MD  Subject: Visit Follow-Up Question    When and how will I get the results of my home sleep study completed last week?

## 2017-07-10 NOTE — Telephone Encounter (Signed)
Any RNA slot is fine. Thanks.

## 2017-07-14 ENCOUNTER — Encounter: Payer: Self-pay | Admitting: Internal Medicine

## 2017-07-17 ENCOUNTER — Encounter: Payer: Self-pay | Admitting: Internal Medicine

## 2017-07-17 ENCOUNTER — Ambulatory Visit (INDEPENDENT_AMBULATORY_CARE_PROVIDER_SITE_OTHER): Payer: Medicare Other | Admitting: Internal Medicine

## 2017-07-17 VITALS — BP 118/70 | HR 61 | Ht 76.0 in | Wt 202.2 lb

## 2017-07-17 DIAGNOSIS — G4731 Primary central sleep apnea: Secondary | ICD-10-CM

## 2017-07-17 DIAGNOSIS — J342 Deviated nasal septum: Secondary | ICD-10-CM

## 2017-07-17 DIAGNOSIS — G4733 Obstructive sleep apnea (adult) (pediatric): Secondary | ICD-10-CM | POA: Diagnosis not present

## 2017-07-17 NOTE — Progress Notes (Signed)
06/16/17- 71 year old male for sleep evaluation. Pt referred by Dr. Annice PihSteven Hunter due to snoring at night and gasping at night. Pt states that when he wakes up in the morning, he does feel rested. Pt has never had a sleep study performed. Medical problem list includes BPH, CKD III, HBP, osteoarthritis. Epworth score 7/24 He knows that he is snored all his life if he lies on his back, so he prefers to sleep on his sides. Girlfriend told him of snoring and witnessed apneas. Long history of nighttime muscle cramps and restless legs has been significantly improved since he began taking magnesium supplement. He feels these problems now are satisfactory ENT-history of nasal fracture with no surgery. No history of heart or lung disease. Active tennis player. Just back from trip to [place].  07/17/17- 71 year old male never smoker followed for OSA, insomnia, complicated by  BPH, CKD III, HBP, osteoarthritis, restless legs/muscle cramps, gout, GERD, nasal septal deviation Unattended Home Sleep Test-06/30/17-severe Complex (obstructive, central and mixed) Sleep Apnea, AHI 37.8/hour, desaturation to 84%, body weight 204 pounds. Follows for: OSA- sleep study results We did review his sleep study and discussed medical concerns associated specifically with obstructive sleep apnea and central sleep apnea. He is about to return to [place] with plan to return here within the insurance regulation window for CPAP follow-up. He is committed to trying CPAP.   ROS-see HPI    "+" + pos Constitutional:    weight loss, night sweats, fevers, chills, fatigue, lassitude. HEENT:    headaches, difficulty swallowing, tooth/dental problems, sore throat,       sneezing, itching, ear ache, nasal congestion, post nasal drip, snoring CV:    chest pain, orthopnea, PND, swelling in lower extremities, anasarca,                                                      dizziness, palpitations Resp:   shortness of breath with exertion or at  rest.                productive cough,   non-productive cough, coughing up of blood.              change in color of mucus.  wheezing.   Skin:    rash or lesions. GI:  No-   heartburn, indigestion, abdominal pain, nausea, vomiting, diarrhea,                 change in bowel habits, loss of appetite GU: dysuria, change in color of urine, no urgency or frequency.   flank pain. MS:   joint pain, stiffness, decreased range of motion, + back pain. Neuro-     nothing unusual Psych:  change in mood or affect.  depression or anxiety.   memory loss.  OBJ- Physical Exam General- Alert, Oriented, Affect-appropriate, Distress- none acute, + tall lean man Skin- rash-none, lesions- none, excoriation- none Lymphadenopathy- none Head- atraumatic            Eyes- Gross vision intact, PERRLA, conjunctivae and secretions clear            Ears- Hearing, canals-normal            Nose- Clear, no-Septal dev, mucus, polyps, erosion, perforation             Throat- Mallampati II , mucosa clear , drainage- none,  tonsils- atrophic Neck- flexible , trachea midline, no stridor , thyroid nl, carotid no bruit Chest - symmetrical excursion , unlabored           Heart/CV- RRR , no murmur , no gallop  , no rub, nl s1 s2                           - JVD- none , edema- none, stasis changes- none, varices- none           Lung- clear to P&A, wheeze- none, cough- none , dullness-none, rub- none           Chest wall-  Abd-  Br/ Gen/ Rectal- Not done, not indicated Extrem- cyanosis- none, clubbing, none, atrophy- none, strength- nl Neuro- grossly intact to observation

## 2017-07-17 NOTE — Patient Instructions (Addendum)
Order- new DME (He lives most of the year in [place]), new CPAP auto 5-20, mask of choice, humidifier, supplies, AirView    Dx OSA  Ok to keep appointment for follow-up   Please call as needed

## 2017-07-18 DIAGNOSIS — J342 Deviated nasal septum: Secondary | ICD-10-CM | POA: Insufficient documentation

## 2017-07-18 NOTE — Assessment & Plan Note (Signed)
His septal deviation causes some upper airway obstruction and may eventually require attention. I offered ENT referral. I discussed management of OSA is our first goal. The mixed and central components may improve with this. Central sleep apnea because sleep disturbance but is more often a symptom of cardiac and cerebrovascular disease with decreased brainstem perfusion. He is not otherwise symptomatic. Plan-CPAP auto 5-20. Consider ENT referral. I suggested he consider arranging CPAP follow-up in [place] where he lives more of the time but he wants to follow here.

## 2017-07-18 NOTE — Assessment & Plan Note (Signed)
ENT referral was offered

## 2017-09-24 ENCOUNTER — Ambulatory Visit: Payer: Medicare Other | Admitting: Internal Medicine

## 2017-09-26 ENCOUNTER — Other Ambulatory Visit: Payer: Self-pay | Admitting: Family Medicine

## 2017-10-07 ENCOUNTER — Telehealth: Payer: Self-pay | Admitting: Family Medicine

## 2017-10-07 NOTE — Telephone Encounter (Signed)
Copied from CRM [phone]. Topic: Quick Communication - Rx Refill/Question >> Oct 07, 2017  2:57 PM Leafy Roobinson, Norma J wrote: Has the patient contacted their pharmacy? no (Agent: If no, request that the patient contact the pharmacy for the refill.) pt needs a refill on uloric 40 mg #90   Preferred Pharm[facility] (with phone number or street name): [facility] [phone] reference number [id number]  Agent: Please be advised that RX refills may take up to 3 business days. We ask that you follow-up with your pharmacy.

## 2017-10-09 NOTE — Telephone Encounter (Signed)
Attempted to call pt. to discuss need for refill on Uloric, as rec'd request from [facility].  Not[place] documentation.  Left voice message to return call to Dr. Erasmo LeventhalHunter's office, to discuss if he has established with a physician in [place].

## 2017-10-17 MED ORDER — FEBUXOSTAT 40 MG PO TABS: 40.0000 mg | ORAL_TABLET | Freq: Every day | ORAL | 2 refills | Status: DC

## 2017-10-17 NOTE — Telephone Encounter (Signed)
Patient is returning call to let the office know that he stays in [place] in the winter. [place]- he will be in town in February and he has scheduled his annual exam for them. He is aware of his provider's move and the new address.Will refill Rx through that appointment

## 2017-12-17 ENCOUNTER — Encounter: Payer: Self-pay | Admitting: Internal Medicine

## 2017-12-30 ENCOUNTER — Encounter: Payer: Medicare Other | Admitting: Family Medicine

## 2018-05-11 ENCOUNTER — Ambulatory Visit: Payer: Self-pay

## 2018-05-11 NOTE — Telephone Encounter (Signed)
Pt. called to report right sided chest pain, about 6 " below the axilla.  Denied radiation of the pain.  Stated the pain has been intermittent over past 2 weeks.  It worsens when laying on the right side, with bending over, or twisting.  Able to lay on back, or left side, without feeling the chest discomfort.  Stated he can press on the area, and the pain is more pronounced. Stated the pain does not increase with exercise; plays competitive tennis, runs and walks, with regularity.  Denied shortness of breath.  Denied pain with taking deep breath.  Stated the pain is similar to that of a broken rib.  Denied any injury to area.  Stated he had a similar episode about 5 years ago, but they couldn't determine the cause.  Reported he is traveling the next 2 weeks, and would like to be seen this week.  Called [facility]); was approved to schedule in same day appt. slot on 7/9, with Dr. Durene Cal.  Pt. was given appt. 7/9 @ 9:30 AM.  Almira Coaster w/ plan.  Encouraged to go to ER if symptoms worsen.  Verb. Understanding.     Reason for Disposition . [1] Chest pain(s) lasting a few seconds AND [2] persists > 3 days    C/o chest pain right lateral chest about 6" below axilla; aggravated with laying on right side, bending or twisting.  Denied continued pain; only lasts as long as the activity that aggravates it.  Answer Assessment - Initial Assessment Questions 1. LOCATION: "Where does it hurt?"       Right side of chest about 6 " below the axilla 2. RADIATION: "Does the pain go anywhere else?" (e.g., into neck, jaw, arms, back)     Non radiating 3. ONSET: "When did the chest pain begin?" (Minutes, hours or days)      Started about 2 weeks ago 4. PATTERN "Does the pain come and go, or has it been constant since it started?"  "Does it get worse with exertion?"     Constant with certain positions or activity 5. DURATION: "How long does it last" (e.g., seconds, minutes, hours)     See above 6. SEVERITY: "How bad is the  pain?"  (e.g., Scale 1-10; mild, moderate, or severe)    - MILD (1-3): doesn't interfere with normal activities     - MODERATE (4-7): interferes with normal activities or awakens from sleep    - SEVERE (8-10): excruciating pain, unable to do any normal activities       5-6/10 when laying down, bending, or twisting  7. CARDIAC RISK FACTORS: "Do you have any history of heart problems or risk factors for heart disease?" (e.g., prior heart attack, angina; high blood pressure, diabetes, being overweight, high cholesterol, smoking, or strong family history of heart disease)    Hx HTN; denied high cholesterol (statin d/c'd 2 mos. Ago), denied overweight, smoking, or strong family hx.  8. PULMONARY RISK FACTORS: "Do you have any history of lung disease?"  (e.g., blood clots in lung, asthma, emphysema, birth control pills)    Denied this hx.  9. CAUSE: "What do you think is causing the chest pain?"     Feels like it is similar pain to that of a broken rib  10. 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  Protocols used: CHEST PAIN-A-AH

## 2018-05-11 NOTE — Telephone Encounter (Signed)
This looks great. Okey RegalCarol- great job on this and getting him in for a same day slot through working with [place].

## 2018-05-12 ENCOUNTER — Ambulatory Visit (INDEPENDENT_AMBULATORY_CARE_PROVIDER_SITE_OTHER): Payer: Medicare Other

## 2018-05-12 ENCOUNTER — Other Ambulatory Visit: Payer: Self-pay | Admitting: Family Medicine

## 2018-05-12 ENCOUNTER — Ambulatory Visit (INDEPENDENT_AMBULATORY_CARE_PROVIDER_SITE_OTHER): Payer: Medicare Other | Admitting: Family Medicine

## 2018-05-12 ENCOUNTER — Encounter: Payer: Self-pay | Admitting: Family Medicine

## 2018-05-12 VITALS — BP 120/74 | HR 60 | Temp 98.2°F | Ht 76.0 in | Wt 211.0 lb

## 2018-05-12 DIAGNOSIS — I10 Essential (primary) hypertension: Secondary | ICD-10-CM | POA: Diagnosis not present

## 2018-05-12 DIAGNOSIS — R079 Chest pain, unspecified: Secondary | ICD-10-CM

## 2018-05-12 DIAGNOSIS — N183 Chronic kidney disease, stage 3 unspecified: Secondary | ICD-10-CM

## 2018-05-12 DIAGNOSIS — E785 Hyperlipidemia, unspecified: Secondary | ICD-10-CM | POA: Diagnosis not present

## 2018-05-12 DIAGNOSIS — Z1211 Encounter for screening for malignant neoplasm of colon: Secondary | ICD-10-CM

## 2018-05-12 DIAGNOSIS — Z1159 Encounter for screening for other viral diseases: Secondary | ICD-10-CM

## 2018-05-12 NOTE — Patient Instructions (Addendum)
Health Maintenance Due  Topic Date Due  . Hepatitis C Screening - Today at office visit 07-Jun-1946  . TETANUS/TDAP - Informed patient to have it done at his pharmacy. 11/04/2017  . COLONOSCOPY - Patient will schedule an appointment with [facility] 03/02/2018   Please check with your pharmacy to see if they have the shingrix vaccine. If they do- please get this immunization and update us by phone call or mychart with dates you receive the vacc  Sign release of information at the check out desk for last physical and labwork from [place] just so we can have those on record  Please stop by x-ray before you go  I want you to ice your right axilla 3x a day for 20 minutes. After 3 days I want you to try heat 3x a day for 20 minutes.   You can also try icy hot or aspercreme on the area.   Suspect this will resolve within 3-4 weeks

## 2018-05-12 NOTE — Assessment & Plan Note (Signed)
Off statin per MD in [place] . didn't improve PMR pain. Plan is to recheck lipids off statin per patient- we will get records

## 2018-05-12 NOTE — Assessment & Plan Note (Signed)
Will get records to see what recent cr/GFR has been- patient had recent labwork in [place]. Should remain off nsaids- thus avoiding for rib pain- plus already on prednisone as antiinflammatory

## 2018-05-12 NOTE — Progress Notes (Signed)
Subjective:  [name] MartinezDon E Jehle is a 72 y.o. year old very pleasant male patient who presents for/with See problem oriented charting ROS- right sided rib pain without central chest pain. No exertional chest pain or rib pain. No shortness of breath. No fever, chills, rash over area of pain   Past Medical History-  Patient Active Problem List   Diagnosis Date Noted  . CKD (chronic kidney disease), stage III (HCC) 06/30/2014    Priority: Medium  . Hypertension 11/18/2012    Priority: Medium  . Gout 02/24/2012    Priority: Medium  . BENIGN PROSTATIC HYPERTROPHY, WITH URINARY OBSTRUCTION 10/12/2010    Priority: Medium  . Hyperlipidemia, unspecified 01/22/2008    Priority: Medium  . Impaired fasting glucose 01/22/2008    Priority: Medium  . History of skin cancer 06/30/2014    Priority: Low  . Osteoarthritis 06/30/2014    Priority: Low  . Nasal septal deviation 07/18/2017  . 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 febuxostat (ULORIC) 40 MG tablet Take 1 tablet (40 mg total) by mouth daily. 90 tablet 2  . losartan (COZAAR) 50 MG tablet Take 1 tablet (50 mg total) by mouth daily. 90 tablet 2  . magnesium gluconate (MAGONATE) 500 MG tablet Take 500 mg by mouth daily.    Marland Kitchen. omeprazole (PRILOSEC) 20 MG capsule TAKE 1 CAPSULE BY MOUTH EVERY DAY 90 capsule 2  . predniSONE (DELTASONE) 1 MG tablet TAKE 4 TABLETS BY MOUTH ONCE DAILY AS DIRECTED REDUCE  AS  PER  INSTRUCTIONS  0  . predniSONE (DELTASONE) 5 MG tablet Take 10 mg by mouth daily.  0  . sildenafil (VIAGRA) 100 MG tablet Take 1 tablet (100 mg total) by mouth daily as needed for erectile dysfunction. 10 tablet 5  . zolpidem (AMBIEN) 5 MG tablet Take 1 tablet (5 mg total) by mouth at bedtime as needed for sleep. 10 tablet 5   No current facility-administered medications for this visit.     Objective: BP 120/74 (BP Location: Left Arm,  Patient Position: Sitting, Cuff Size: Normal)   Pulse 60   Temp 98.2 F (36.8 C) (Oral)   Ht 6\' 4"  (1.93 m)   Wt 211 lb (95.7 kg)   SpO2 99%   BMI 25.68 kg/m  Gen: NAD, resting comfortably CV: RRR no murmurs rubs or gallops Pinpoint pain in right axilla between two ribs Lungs: CTAB no crackles, wheeze, rhonchi Abdomen: soft/nontender other than mild pain with deep palpation of RUQ/nondistended/normal bowel sounds. No rebound or guarding.  Ext: no edema Skin: warm, dry  EKG: sinus rhythm with rate 63, normal axis, normal axis, normal intervals, no hypertrophy, no st or t wave changes. Unchanged from 11/25/12 except noted as right axis at that time.   Assessment/Plan:  Patient [place] PCP in [place] as well since he splits his time between the two areas  Chest pain, unspecified type - Plan: EKG 12-Lead, CANCELED: DG Ribs Unilateral Right S: right axillary pain- worse with palpation. If he is walking around he forgets about it. Cant lay on that side. Started 2 weeks ago an got worse until a week ago. Bothers him for 10 minutes after pressing on it. Wakes him up if he rolls over on that side.   Able to play tennis or walk for 2 miles with no pain in that area at all as long as he doesn't press on it.   No  shortness of breath. No relation to food.   Diagnosed with PMR a month ago- seeing rheumatologist in [place]- he is at 9 mg of prednisone right now- going down 1 mg every 3 weeks.  A/P: EKG reassuring. Pain is pinpoint and high likelihood MSK related.  From avs "I want you to ice your right axilla 3x a day for 20 minutes. After 3 days I want you to try heat 3x a day for 20 minutes.   You can also try icy hot or aspercreme on the area.   Suspect this will resolve within 3-4 weeks ". He should see [place] back for new or worsening symptoms like worsening pain, fever, shortness of breath, change in pain pattern, exertional pain. Prednisone is antiinflammatory and should provide some relief as  well.   Hyperlipidemia, unspecified Off statin per MD in [place] . didn't improve PMR pain. Plan is to recheck lipids off statin per patient- we will get records   Hypertension S: controlled on losartan 50mg  BP Readings from Last 3 Encounters:  05/12/18 120/74  07/17/17 118/70  06/16/17 124/64  A/P: We discussed blood pressure goal of <140/90. Continue current meds    CKD (chronic kidney disease), stage III Will get records to see what recent cr/GFR has been- patient had recent labwork in [place]. Should remain off nsaids- thus avoiding for rib pain- plus already on prednisone as antiinflammatory  Primarily followed in [place]. We will get colonoscopy updated which may be his last if reassuring results. Will need GF to sit with him.   Lab/Order associations: Chest pain, unspecified type - Plan: EKG 12-Lead, CANCELED: DG Ribs Unilateral Right  Screen for colon cancer - Plan: Ambulatory referral to Gastroenterology  Encounter for hepatitis C screening test for low risk patient - Plan: Hepatitis C Antibody  Hyperlipidemia, unspecified hyperlipidemia type  Essential hypertension  CKD (chronic kidney disease), stage III (HCC)  Return precautions advised.  Tana Conch, MD

## 2018-05-12 NOTE — Assessment & Plan Note (Signed)
S: controlled on losartan 50mg  BP Readings from Last 3 Encounters:  05/12/18 120/74  07/17/17 118/70  06/16/17 124/64  A/P: We discussed blood pressure goal of <140/90. Continue current meds

## 2018-05-13 LAB — HEPATITIS C ANTIBODY
Hepatitis C Ab: NONREACTIVE
SIGNAL TO CUT-OFF: 0.07 (ref <1.00)

## 2018-06-07 ENCOUNTER — Encounter: Payer: Self-pay | Admitting: Family Medicine

## 2018-06-08 ENCOUNTER — Other Ambulatory Visit: Payer: Self-pay

## 2018-06-08 ENCOUNTER — Other Ambulatory Visit (INDEPENDENT_AMBULATORY_CARE_PROVIDER_SITE_OTHER): Payer: Medicare Other

## 2018-06-08 DIAGNOSIS — M353 Polymyalgia rheumatica: Secondary | ICD-10-CM

## 2018-06-08 LAB — SEDIMENTATION RATE: Sed Rate: 18 mm/hr (ref 0–20)

## 2018-06-08 LAB — C-REACTIVE PROTEIN: CRP: 0.2 mg/dL — AB (ref 0.5–20.0)

## 2018-06-13 ENCOUNTER — Other Ambulatory Visit: Payer: Self-pay | Admitting: Family Medicine

## 2018-07-13 ENCOUNTER — Encounter: Payer: Self-pay | Admitting: Family Medicine

## 2018-10-08 ENCOUNTER — Encounter: Payer: Self-pay | Admitting: Family Medicine

## 2018-10-08 MED ORDER — ZOLPIDEM TARTRATE 5 MG PO TABS: 5.0000 mg | ORAL_TABLET | Freq: Every evening | ORAL | 2 refills | Status: DC | PRN

## 2019-01-21 ENCOUNTER — Other Ambulatory Visit: Payer: Self-pay | Admitting: Family Medicine

## 2019-01-21 NOTE — Telephone Encounter (Signed)
Last OV 05/12/2018 Last refill 06/15/2018 #90/2 Next OV not scheduled

## 2019-02-01 ENCOUNTER — Encounter: Payer: Self-pay | Admitting: Family Medicine

## 2019-03-06 IMAGING — DX DG RIBS W/ CHEST 3+V*R*
5 series · 5 of 5 positions shown · non-contrast
Comparison: 05/22/2011

CLINICAL DATA: Right lateral rib pain.  No known injury.

EXAM:
RIGHT RIBS AND CHEST - 3+ VIEW

[chest pa]
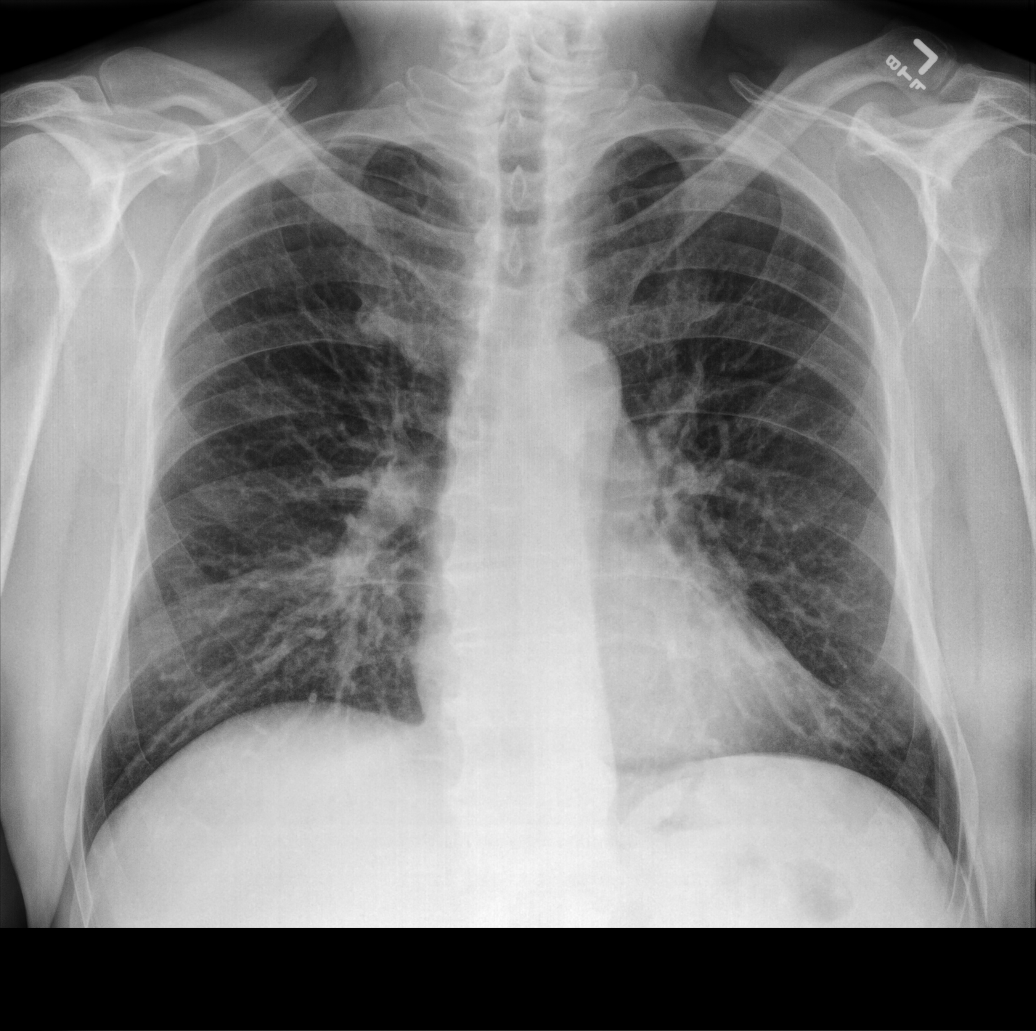

[hemithorax (ribs) ap (1 of 2)]
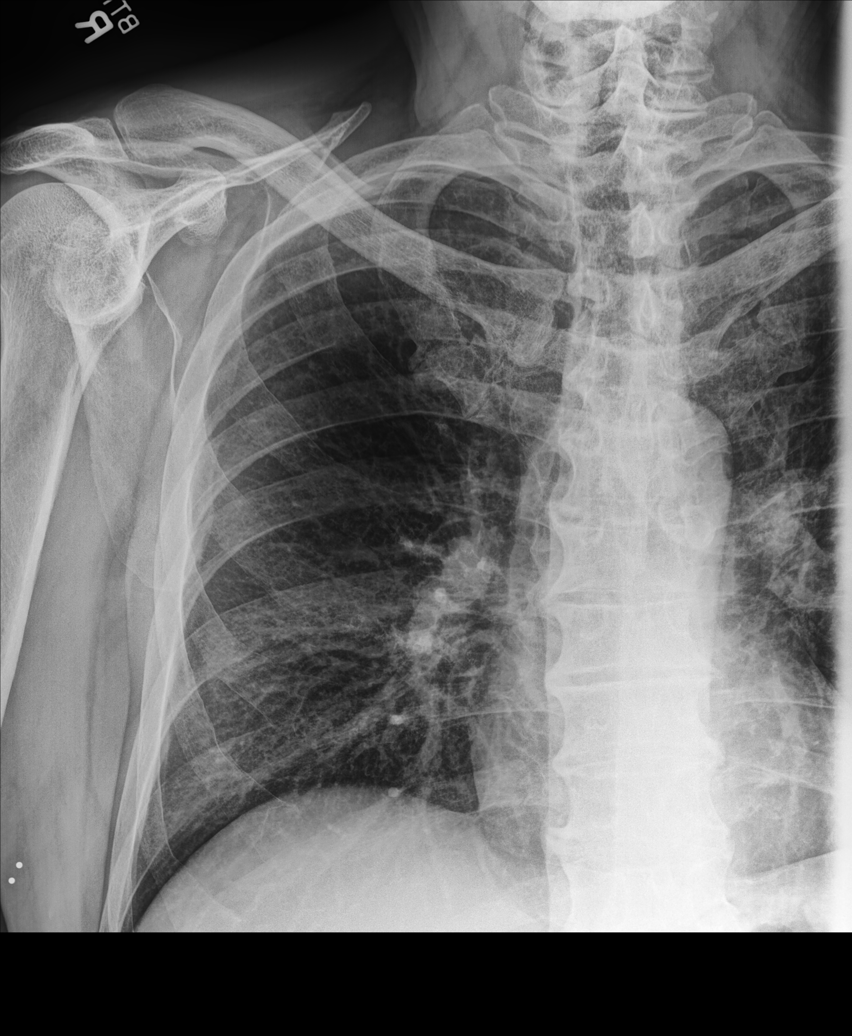

[hemithorax (ribs) ap (2 of 2)]
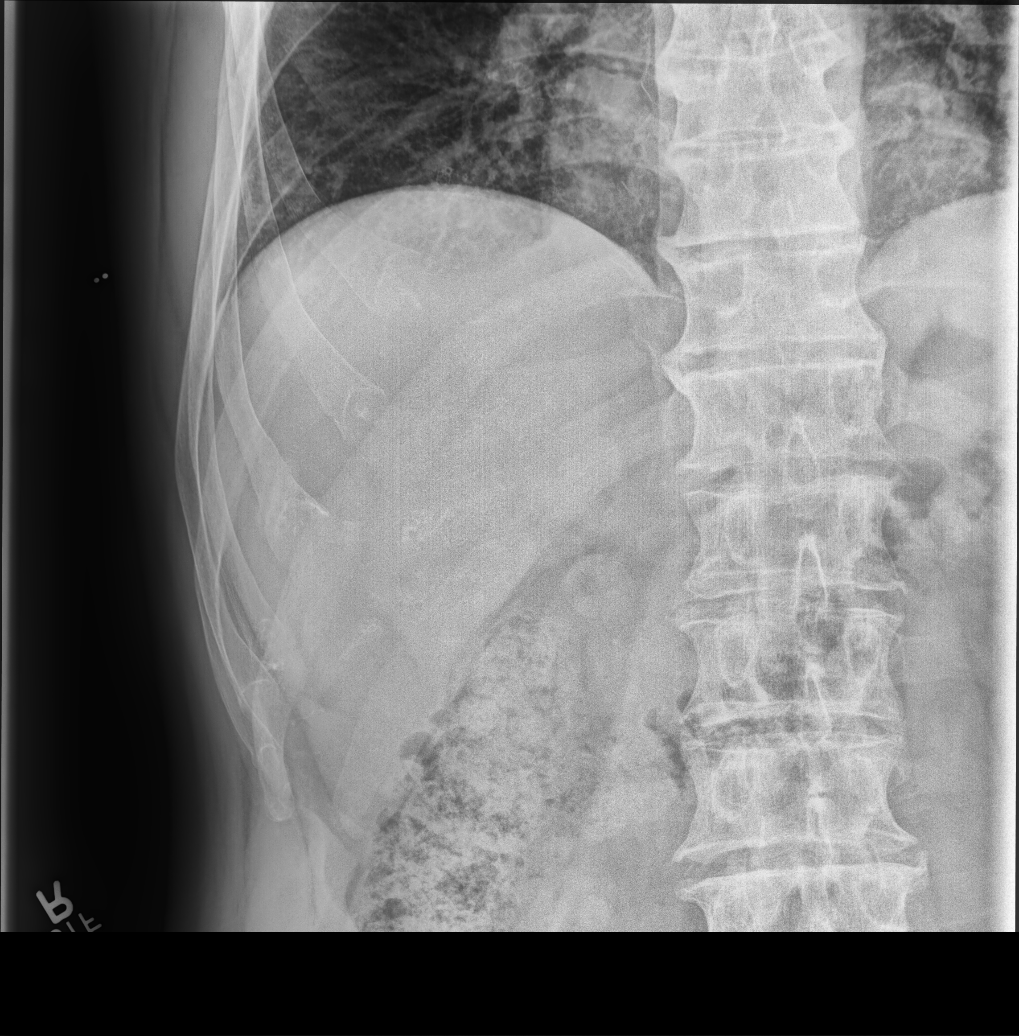

[shoulder ap]
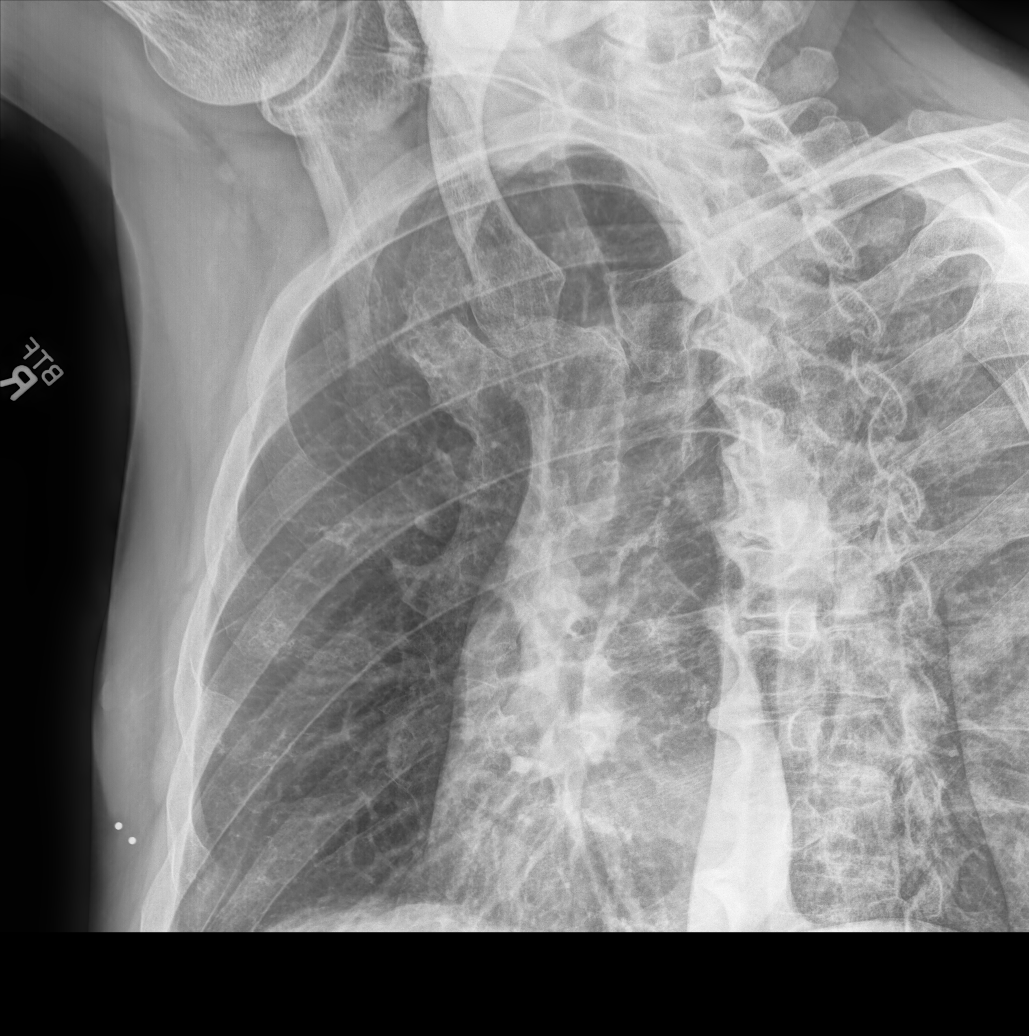

[oblique ribs oblique]
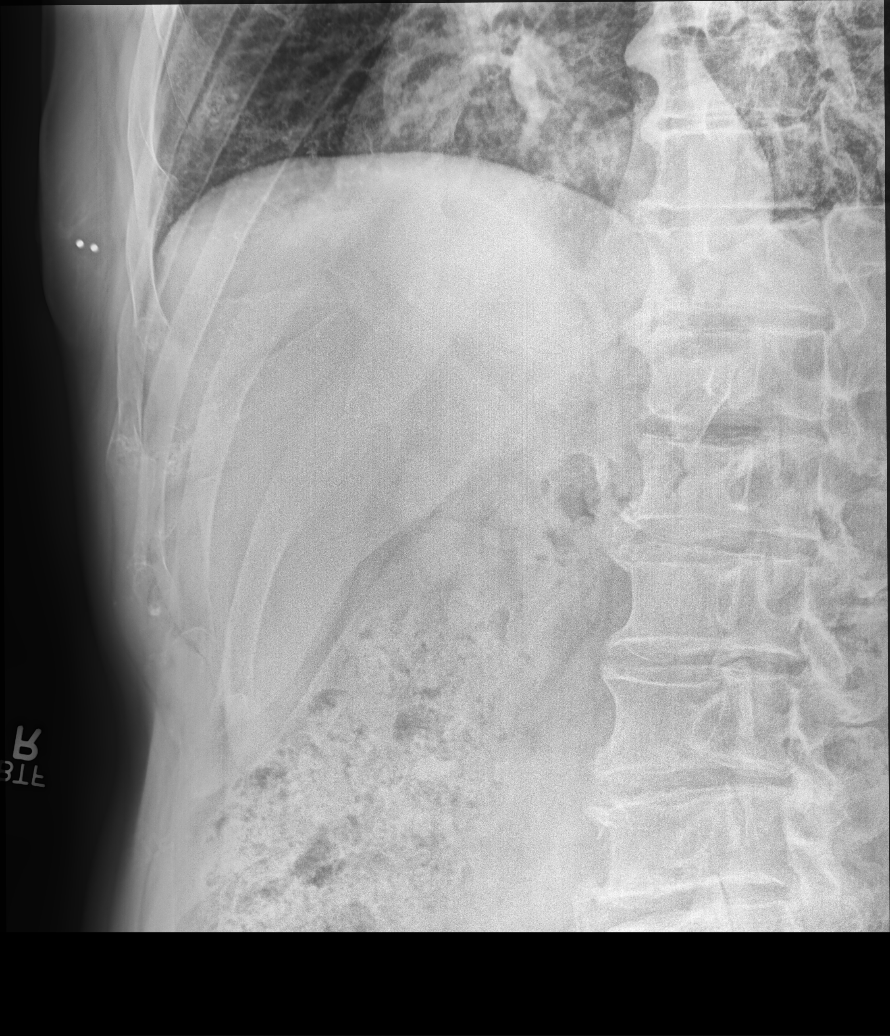

[5 of 5 positions shown; findings below may reference images not displayed]

FINDINGS: Heart size is normal. Mediastinal shadows are normal. The lungs are
clear. No pneumothorax or pleural fluid. Right rib films show a
fracture of the anterior right eighth rib. This is nondisplaced and
may be subacute.
IMPRESSION: Fracture of the anterior right eighth rib, nondisplaced. Possibly
subacute. No active cardiopulmonary disease.

## 2019-03-10 ENCOUNTER — Other Ambulatory Visit: Payer: Self-pay | Admitting: Family Medicine

## 2019-03-10 ENCOUNTER — Encounter: Payer: Self-pay | Admitting: Family Medicine

## 2019-03-10 ENCOUNTER — Other Ambulatory Visit: Payer: Self-pay

## 2019-03-10 NOTE — Telephone Encounter (Signed)
Rx approved for 30 days

## 2019-03-10 NOTE — Telephone Encounter (Signed)
Patient need to schedule an ov for more refills. 

## 2019-03-10 NOTE — Telephone Encounter (Signed)
Patient is schedule for a f/u visit tomorrow for bp.

## 2019-03-10 NOTE — Telephone Encounter (Signed)
Message routed to PCP CMA  

## 2019-03-10 NOTE — Telephone Encounter (Signed)
Patient has an ov scheduled for tomorrow.

## 2019-03-10 NOTE — Telephone Encounter (Signed)
Rx refill  Last OV 05/12/18 Losartan 50MG   Last fill 06/15/18  #90/2 Omeprazole 20MG   Last fill 06/15/18  #90/2

## 2019-03-10 NOTE — Patient Instructions (Addendum)
Health Maintenance Due  Topic Date Due  . TETANUS/TDAP Patient will check on this. 11/04/2017  . COLONOSCOPY Patient has not scheduled yet.He is going to try to get on the schedule for colonoscopy when he comes back in town-if we have issues with this we will order Cologuard 03/02/2018    Depression screen [facility] 2/9 05/12/2018 05/17/2016 12/29/2013  Decreased Interest 0 0 0  Down, Depressed, Hopeless 0 0 0  PHQ - 2 Score 0 0 0   Video visit

## 2019-03-11 ENCOUNTER — Ambulatory Visit (INDEPENDENT_AMBULATORY_CARE_PROVIDER_SITE_OTHER): Payer: Medicare Other | Admitting: Family Medicine

## 2019-03-11 VITALS — Temp 96.2°F | Ht 76.0 in | Wt 203.0 lb

## 2019-03-11 DIAGNOSIS — M109 Gout, unspecified: Secondary | ICD-10-CM | POA: Diagnosis not present

## 2019-03-11 DIAGNOSIS — Z8739 Personal history of other diseases of the musculoskeletal system and connective tissue: Secondary | ICD-10-CM | POA: Insufficient documentation

## 2019-03-11 DIAGNOSIS — E785 Hyperlipidemia, unspecified: Secondary | ICD-10-CM

## 2019-03-11 DIAGNOSIS — N183 Chronic kidney disease, stage 3 unspecified: Secondary | ICD-10-CM

## 2019-03-11 DIAGNOSIS — I10 Essential (primary) hypertension: Secondary | ICD-10-CM

## 2019-03-11 DIAGNOSIS — M353 Polymyalgia rheumatica: Secondary | ICD-10-CM

## 2019-03-11 DIAGNOSIS — G629 Polyneuropathy, unspecified: Secondary | ICD-10-CM | POA: Insufficient documentation

## 2019-03-11 MED ORDER — OMEPRAZOLE 20 MG PO CPDR: DELAYED_RELEASE_CAPSULE | ORAL | 3 refills | Status: DC

## 2019-03-11 MED ORDER — FEBUXOSTAT 40 MG PO TABS: ORAL_TABLET | ORAL | 3 refills | Status: DC

## 2019-03-11 MED ORDER — LOSARTAN POTASSIUM 50 MG PO TABS: 50.0000 mg | ORAL_TABLET | Freq: Every day | ORAL | 3 refills | Status: DC

## 2019-03-11 NOTE — Progress Notes (Signed)
Phone [phone]   Subjective:  Virtual visit via Video note. Chief complaint: Chief Complaint  Patient presents with  . Hypertension    Follow up   This visit type was conducted due to national recommendations for restrictions regarding the COVID-19 Pandemic (e.g. social distancing).  This format is felt to be most appropriate for this patient at this time balancing risks to patient and risks to population by having him in for in person visit.  No physical exam was performed (except for noted visual exam or audio findings with Telehealth visits).    Our team/I connected with Monica Martinezon E Steinhardt on 03/11/19 at  8:00 AM EDT by a video enabled telemedicine application ([email or web address]) and verified that I am speaking with the correct person using two identifiers.  Location patient: [place] Location provider: [facility], office Persons participating in the virtual visit:  patient  Our team/I discussed the limitations of evaluation and management by telemedicine and the availability of in person appointments. In light of current covid-19 pandemic, patient also understands that we are trying to protect them by minimizing in office contact if at all possible.  The patient expressed consent for telemedicine visit and agreed to proceed. Patient understands insurance will be billed.   ROS- no fever/chills/cough/shortness of breath. No chest pain.    Past Medical History-  Patient Active Problem List   Diagnosis Date Noted  . Polymyalgia rheumatica (HCC) 03/11/2019    Priority: High  . CKD (chronic kidney disease), stage III (HCC) 06/30/2014    Priority: Medium  . Hypertension 11/18/2012    Priority: Medium  . Gout 02/24/2012    Priority: Medium  . BENIGN PROSTATIC HYPERTROPHY, WITH URINARY OBSTRUCTION 10/12/2010    Priority: Medium  . Hyperlipidemia, unspecified 01/22/2008    Priority: Medium  . Impaired fasting glucose 01/22/2008    Priority: Medium  . History of skin cancer 06/30/2014   Priority: Low  . Osteoarthritis 06/30/2014    Priority: Low  . Neuropathy 03/11/2019  . Nasal septal deviation 07/18/2017  . 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 febuxostat (ULORIC) 40 MG tablet TAKE 1 TABLET DAILY 90 tablet 3  . losartan (COZAAR) 50 MG tablet Take 1 tablet (50 mg total) by mouth daily. 90 tablet 3  . magnesium gluconate (MAGONATE) 500 MG tablet Take 500 mg by mouth daily.    Marland Kitchen. omeprazole (PRILOSEC) 20 MG capsule TAKE 1 CAPSULE BY MOUTH EVERY DAY 90 capsule 3  . predniSONE (DELTASONE) 1 MG tablet TAKE 4 TABLETS BY MOUTH ONCE DAILY AS DIRECTED REDUCE  AS  PER  INSTRUCTIONS  0  . sildenafil (VIAGRA) 100 MG tablet Take 1 tablet (100 mg total) by mouth daily as needed for erectile dysfunction. (Patient not taking: Reported on 03/10/2019) 10 tablet 5  . zolpidem (AMBIEN) 5 MG tablet Take 1 tablet (5 mg total) by mouth at bedtime as needed for sleep. (Patient not taking: Reported on 03/10/2019) 10 tablet 2   No current facility-administered medications for this visit.      Objective:  Temp (!) 96.2 F (35.7 C) (Oral)   Ht 6\' 4"  (1.93 m)   Wt 203 lb (92.1 kg)   BMI 24.71 kg/m  self reported vitals Gen: NAD, resting comfortably Lungs: nonlabored, normal respiratory rate  Skin: appears dry, no obvious rash    Assessment and Plan   #hypertension/CKD stage III S: controlled on losartan 50mg  at last check  at rheumatologist 120s/70s he remembers  For CKD stage III- was told last creatinine was stable in [place].  He avoids NSAIDs BP Readings from Last 3 Encounters:  05/12/18 120/74  07/17/17 118/70  06/16/17 124/64  A/P: Stable. Continue current medications.   # PMR S:down to 2 mg prednisone and 1 next week- has not had recurrence of PMR. Other aches and pains are back but PMR has not recurred.   A/P: doing well- glad he has been able to wean down on medication   #Gout S: 0 flares in last year on uloric 40mg   Patient was having gout flares even on allopurinol 300mg  per Dr. Loletha Carrow message with patient on 04/14/14. At that time he was switched to [facility] and since that time has had minimal gout flares Lab Results  Component Value Date   LABURIC 5.7 06/23/2014  A/P: Patient doing really well on current regimen-refilled Uloric for a year.  Would get uric acid if he comes back for labs  #hyperlipidemia S: taken off of statin due to PMR- came off for 2 weeks and didn't make a difference - hasnt taken since last may June Kizzie Bane PCP recommended) Lab Results  Component Value Date   CHOL 146 05/13/2016   HDL 37.20 (L) 05/13/2016   LDLCALC 81 05/13/2016   LDLDIRECT 76.0 06/28/2013   TRIG 137.0 05/13/2016   CHOLHDL 4 05/13/2016   A/P: Unknown control off statin-I recommended updated lipid panel- he states he is likely to do that when he comes back in the state in June or July-he will contact us to get labs ordered  Other notes: 1.  When he is in town in June and July-would get A1c under hyperglycemia given prior A1c up to 6 years ago 2.  Will get CBC, CMP, lipid panel on her hyperlipidemia unspecified 3.  Patient reportsNeuropathy in bottom of feet has gotten worse and has noted some balance issues with it.  He reports normal B12 level and is on B complex.  Could still consider B12 level and TSH 4.  Reports he is off finasteride and has not noted any significant difference 5.  He is going to try to get on the schedule for colonoscopy when he comes back in town-if we have issues with this we will order Cologuard  He will contact me about labs in June or July.  Plan would be to do a visit at least once a year if not every 6 months-he is also seeing PCP in [place] so some of the visits can be there  Lab/Order associations: Hyperlipidemia, unspecified hyperlipidemia type  Gout of multiple sites, unspecified cause, unspecified chronicity  Essential  hypertension  CKD (chronic kidney disease), stage III (HCC)  Neuropathy  Polymyalgia rheumatica (HCC)  Meds ordered this encounter  Medications  . febuxostat (ULORIC) 40 MG tablet    Sig: TAKE 1 TABLET DAILY    Dispense:  90 tablet    Refill:  3  . losartan (COZAAR) 50 MG tablet    Sig: Take 1 tablet (50 mg total) by mouth daily.    Dispense:  90 tablet    Refill:  3  . omeprazole (PRILOSEC) 20 MG capsule    Sig: TAKE 1 CAPSULE BY MOUTH EVERY DAY    Dispense:  90 capsule    Refill:  3    Return precautions advised.  Tana Conch, MD

## 2019-03-11 NOTE — Assessment & Plan Note (Signed)
Treated by rheumatologist.  Being weaned off prednisone in 2020

## 2019-03-16 ENCOUNTER — Telehealth: Payer: Self-pay

## 2019-03-16 NOTE — Telephone Encounter (Signed)
Rx fill.  Pharmacy asking for alternative medication for Febuxostat 40.   Allopurinol 100mg 

## 2019-03-16 NOTE — Telephone Encounter (Signed)
From last note "Patient was having gout flares even on allopurinol 300mg  per Dr. Loletha Carrow message with patient on 04/14/14. At that time he was switched to [facility] and since that time has had minimal gout flares"  May need prior auth

## 2019-03-18 NOTE — Telephone Encounter (Signed)
See note

## 2019-03-18 NOTE — Telephone Encounter (Signed)
Please check on the status of this

## 2019-03-18 NOTE — Telephone Encounter (Signed)
PA started today.  Patient aware.

## 2019-03-18 NOTE — Telephone Encounter (Signed)
Gladis Riffle Key: [facility] Need help? Call us at [phone]  Status  New(Not sent to plan)  Cannot find matching patient with Name and Date of Birth provided. For additional information, please contact the phone number on the back of the member prescription ID card.  DrugFebuxostat 40MG  tablets  FormOptumRx Medicare Part D Electronic Prior Authorization Form (2017 [place])  ? There was an error with your request  ?  Cannot find matching patient with Name and Date of Birth provided. For additional information, please contact the phone number on the back of the member prescription     Called pt to verify insurance earlier after receiving call from insurance company stating pt was not a member.

## 2019-03-18 NOTE — Telephone Encounter (Signed)
Called number provided no answer vm box is full.

## 2019-03-18 NOTE — Telephone Encounter (Signed)
Insurance company called that the PA was faxed to. He states the pt is not a member. Please advise.    [phone]

## 2019-03-18 NOTE — Telephone Encounter (Signed)
Darren Gouveia Key: [id number] - PA Case ID: [id number] Need help? Call us at [phone]  Status  Sent to [employer]  DrugFebuxostat 40MG  tablets  [facility] Electronic

## 2019-03-18 NOTE — Telephone Encounter (Signed)
Pt returning call. Please call back at [phone].

## 2019-03-19 NOTE — Telephone Encounter (Signed)
Key: [id number] - PA Case ID: [id number] Need help? Call us at [phone]  Outcome  Appro[place] May 14  Your request has been approved  DrugFebuxostat 40MG  tablets  FormCare[employer] Medicare Electronic PA Form   Patient notified of update

## 2019-04-09 ENCOUNTER — Other Ambulatory Visit: Payer: Self-pay | Admitting: Family Medicine

## 2019-05-31 ENCOUNTER — Telehealth: Payer: Medicare Other | Admitting: Family

## 2019-05-31 DIAGNOSIS — L739 Follicular disorder, unspecified: Secondary | ICD-10-CM | POA: Diagnosis not present

## 2019-05-31 MED ORDER — CEPHALEXIN 500 MG PO CAPS: 500.0000 mg | ORAL_CAPSULE | Freq: Three times a day (TID) | ORAL | 0 refills | Status: DC

## 2019-05-31 NOTE — Progress Notes (Signed)
E Visit for Rash  We are sorry that you are not feeling well. Here is how we plan to help!     Based upon what you have shared with me it looks like you have a bacterial follicultits.  Folliculitis is inflammation of the hair follicles that can be caused by a superficial infection of the skin and is treated with an antibiotic. I have prescribed: and Keflex 500 mg three times per day for 7 days     HOME CARE:   Take cool showers and avoid direct sunlight.  Apply cool compress or wet dressings.  Take a bath in an oatmeal bath.  Sprinkle content of one Aveeno packet under running faucet with comfortably warm water.  Bathe for 15-20 minutes, 1-2 times daily.  Pat dry with a towel. Do not rub the rash.  Use hydrocortisone cream.  Take an antihistamine like Benadryl for widespread rashes that itch.  The adult dose of Benadryl is 25-50 mg by mouth 4 times daily.  Caution:  This type of medication may cause sleepiness.  Do not drink alcohol, drive, or operate dangerous machinery while taking antihistamines.  Do not take these medications if you have prostate enlargement.  Read package instructions thoroughly on all medications that you take.  GET HELP RIGHT AWAY IF:   Symptoms Miciah't go away after treatment.  Severe itching that persists.  If you rash spreads or swells.  If you rash begins to smell.  If it blisters and opens or develops a yellow-brown crust.  You develop a fever.  You have a sore throat.  You become short of breath.  MAKE SURE YOU:  Understand these instructions. Will watch your condition. Will get help right away if you are not doing well or get worse.  Thank you for choosing an e-visit. Your e-visit answers were reviewed by a board certified advanced clinical practitioner to complete your personal care plan. Depending upon the condition, your plan could have included both over the counter or prescription medications. Please review your pharmacy choice.  Be sure that the pharmacy you have chosen is open so that you can pick up your prescription now.  If there is a problem you may message your provider in [place] to have the prescription routed to another pharmacy. Your safety is important to [place]. If you have drug allergies check your prescription carefully.  For the next 24 hours, you can use MyChart to ask questions about today's visit, request a non-urgent call back, or ask for a work or school excuse from your e-visit provider. You will get an email in the next two days asking about your experience. I hope that your e-visit has been valuable and will speed your recovery.     Greater than 5 minutes, yet less than 10 minutes of time have been spent researching, coordinating, and implementing care for this patient today.  Thank you for the details you included in the comment boxes. Those details are very helpful in determining the best course of treatment for you and help [place] to provide the best care.

## 2019-09-07 ENCOUNTER — Telehealth: Payer: Self-pay | Admitting: Family Medicine

## 2019-09-07 ENCOUNTER — Encounter: Payer: Self-pay | Admitting: Family Medicine

## 2019-09-07 ENCOUNTER — Other Ambulatory Visit: Payer: Self-pay

## 2019-09-07 DIAGNOSIS — H9190 Unspecified hearing loss, unspecified ear: Secondary | ICD-10-CM

## 2019-09-07 DIAGNOSIS — E785 Hyperlipidemia, unspecified: Secondary | ICD-10-CM

## 2019-09-07 DIAGNOSIS — E538 Deficiency of other specified B group vitamins: Secondary | ICD-10-CM

## 2019-09-07 DIAGNOSIS — R739 Hyperglycemia, unspecified: Secondary | ICD-10-CM

## 2019-09-07 DIAGNOSIS — Z0000 Encounter for general adult medical examination without abnormal findings: Secondary | ICD-10-CM

## 2019-09-07 DIAGNOSIS — Z1211 Encounter for screening for malignant neoplasm of colon: Secondary | ICD-10-CM

## 2019-09-07 NOTE — Telephone Encounter (Signed)
Copied from CRM #[id number]. Topic: Referral - Request for Referral °>> Sep 07, 2019  9:04 AM Davis, Karen A wrote: °Has patient seen PCP for this complaint? No. °*If NO, is insurance requiring patient see PCP for this issue before PCP can refer them? °Referral for which specialty: Audiologist °Preferred provider/office: none chosen  °Reason for referral: problems with hearing °

## 2019-09-07 NOTE — Telephone Encounter (Signed)
Copied from [place] [phone]. Topic: Referral - Request for Referral >> Sep 07, 2019  9:04 AM Leward Quan A wrote: Has patient seen PCP for this complaint? No. *If NO, is insurance requiring patient see PCP for this issue before PCP can refer them? Referral for which specialty: Audiologist Preferred provider/office: none chosen  Reason for referral: problems with hearing

## 2019-09-07 NOTE — Telephone Encounter (Signed)
Please call and schedule patient for an appointment

## 2019-09-07 NOTE — Telephone Encounter (Signed)
Referral placed.

## 2019-09-09 ENCOUNTER — Other Ambulatory Visit (INDEPENDENT_AMBULATORY_CARE_PROVIDER_SITE_OTHER): Payer: Medicare Other

## 2019-09-09 ENCOUNTER — Other Ambulatory Visit: Payer: Self-pay

## 2019-09-09 DIAGNOSIS — E785 Hyperlipidemia, unspecified: Secondary | ICD-10-CM | POA: Diagnosis not present

## 2019-09-09 DIAGNOSIS — Z0000 Encounter for general adult medical examination without abnormal findings: Secondary | ICD-10-CM

## 2019-09-09 DIAGNOSIS — R739 Hyperglycemia, unspecified: Secondary | ICD-10-CM | POA: Diagnosis not present

## 2019-09-09 DIAGNOSIS — E538 Deficiency of other specified B group vitamins: Secondary | ICD-10-CM

## 2019-09-09 LAB — COMPREHENSIVE METABOLIC PANEL
ALT: 17 U/L (ref 0–53)
AST: 15 U/L (ref 0–37)
Albumin: 4.3 g/dL (ref 3.5–5.2)
Alkaline Phosphatase: 38 U/L — ABNORMAL LOW (ref 39–117)
BUN: 23 mg/dL (ref 6–23)
CO2: 30 mEq/L (ref 19–32)
Calcium: 9.7 mg/dL (ref 8.4–10.5)
Chloride: 106 mEq/L (ref 96–112)
Creatinine, Ser: 1.28 mg/dL (ref 0.40–1.50)
GFR: 55.18 mL/min — ABNORMAL LOW (ref >60.00)
Glucose, Bld: 102 mg/dL — ABNORMAL HIGH (ref 70–99)
Potassium: 4.2 mEq/L (ref 3.5–5.1)
Sodium: 142 mEq/L (ref 135–145)
Total Bilirubin: 0.6 mg/dL (ref 0.2–1.2)
Total Protein: 6.5 g/dL (ref 6.0–8.3)

## 2019-09-09 LAB — VITAMIN B12: Vitamin B-12: 618 pg/mL (ref 211–911)

## 2019-09-09 LAB — LIPID PANEL
Cholesterol: 195 mg/dL (ref 0–200)
HDL: 51.3 mg/dL (ref >39.00)
LDL Cholesterol: 130 mg/dL — ABNORMAL HIGH (ref 0–99)
NonHDL: 143.36
Total CHOL/HDL Ratio: 4
Triglycerides: 68 mg/dL (ref 0.0–149.0)
VLDL: 13.6 mg/dL (ref 0.0–40.0)

## 2019-09-09 LAB — CBC
HCT: 44 % (ref 39.0–52.0)
Hemoglobin: 14.6 g/dL (ref 13.0–17.0)
MCHC: 33.2 g/dL (ref 30.0–36.0)
MCV: 88.6 fl (ref 78.0–100.0)
Platelets: 249 10*3/uL (ref 150.0–400.0)
RBC: 4.97 Mil/uL (ref 4.22–5.81)
RDW: 14 % (ref 11.5–15.5)
WBC: 8.1 10*3/uL (ref 4.0–10.5)

## 2019-09-09 LAB — HEMOGLOBIN A1C: Hgb A1c MFr Bld: 5.9 % (ref 4.6–6.5)

## 2019-09-09 LAB — TSH: TSH: 4.22 u[IU]/mL (ref 0.35–4.50)

## 2019-09-10 ENCOUNTER — Telehealth: Payer: Self-pay | Admitting: Family Medicine

## 2019-09-10 NOTE — Telephone Encounter (Signed)
Copied from [address] [id number]. Topic: General - Other >> Sep 10, 2019 11:52 AM Alanda Slim E wrote: Reason for CRM: Pt returned call to office, doesn't know why he was called and no notes were noted/ please advise

## 2019-09-10 NOTE — Telephone Encounter (Signed)
See open lab not for information

## 2019-09-13 ENCOUNTER — Ambulatory Visit (INDEPENDENT_AMBULATORY_CARE_PROVIDER_SITE_OTHER): Payer: Medicare Other | Admitting: Family Medicine

## 2019-09-13 ENCOUNTER — Other Ambulatory Visit: Payer: Self-pay

## 2019-09-13 ENCOUNTER — Encounter: Payer: Self-pay | Admitting: Family Medicine

## 2019-09-13 VITALS — BP 110/68 | HR 74 | Temp 97.7°F | Ht 76.0 in | Wt 196.4 lb

## 2019-09-13 DIAGNOSIS — R351 Nocturia: Secondary | ICD-10-CM

## 2019-09-13 DIAGNOSIS — N401 Enlarged prostate with lower urinary tract symptoms: Secondary | ICD-10-CM

## 2019-09-13 DIAGNOSIS — Z23 Encounter for immunization: Secondary | ICD-10-CM | POA: Diagnosis not present

## 2019-09-13 DIAGNOSIS — Z1211 Encounter for screening for malignant neoplasm of colon: Secondary | ICD-10-CM

## 2019-09-13 DIAGNOSIS — M109 Gout, unspecified: Secondary | ICD-10-CM | POA: Diagnosis not present

## 2019-09-13 DIAGNOSIS — R7301 Impaired fasting glucose: Secondary | ICD-10-CM

## 2019-09-13 DIAGNOSIS — M353 Polymyalgia rheumatica: Secondary | ICD-10-CM

## 2019-09-13 DIAGNOSIS — E785 Hyperlipidemia, unspecified: Secondary | ICD-10-CM | POA: Diagnosis not present

## 2019-09-13 DIAGNOSIS — N183 Chronic kidney disease, stage 3 unspecified: Secondary | ICD-10-CM

## 2019-09-13 DIAGNOSIS — I10 Essential (primary) hypertension: Secondary | ICD-10-CM | POA: Diagnosis not present

## 2019-09-13 LAB — PSA: PSA: 3.05 ng/mL (ref 0.10–4.00)

## 2019-09-13 LAB — URIC ACID: Uric Acid, Serum: 6.4 mg/dL (ref 4.0–7.8)

## 2019-09-13 MED ORDER — TETANUS-DIPHTH-ACELL PERTUSSIS 5-2.5-18.5 LF-MCG/0.5 IM SUSP: 0.5000 mL | Freq: Once | INTRAMUSCULAR | 0 refills | Status: AC

## 2019-09-13 MED ORDER — ATORVASTATIN CALCIUM 10 MG PO TABS: 10.0000 mg | ORAL_TABLET | Freq: Every day | ORAL | 3 refills | Status: DC

## 2019-09-13 NOTE — Progress Notes (Signed)
Phone [phone] In person visit   Subjective:   [name] is a 73 y.o. year old very pleasant male patient who presents for/with See problem oriented charting Chief Complaint  Patient presents with  . Follow-up    review labs     ROS- Review of Systems  Constitutional: Negative.   HENT:       Gong to see ENT this week for problems with hearing  Eyes: Negative.   Respiratory: Negative.   Cardiovascular: Negative.   Gastrointestinal: Negative.   Genitourinary: Negative.   Musculoskeletal: Negative.   Skin: Negative.   Neurological: Negative.   Endo/Heme/Allergies: Negative.   Psychiatric/Behavioral: Negative.      Past Medical History-  Patient Active Problem List   Diagnosis Date Noted  . Polymyalgia rheumatica ([place]) 03/11/2019    Priority: High  . CKD (chronic kidney disease), stage III 06/30/2014    Priority: Medium  . Hypertension 11/18/2012    Priority: Medium  . Gout 02/24/2012    Priority: Medium  . BPH associated with nocturia 10/12/2010    Priority: Medium  . Hyperlipidemia, unspecified 01/22/2008    Priority: Medium  . Impaired fasting glucose 01/22/2008    Priority: Medium  . History of skin cancer 06/30/2014    Priority: Low  . Osteoarthritis 06/30/2014    Priority: Low  . Neuropathy 03/11/2019  . Nasal septal deviation 07/18/2017  . 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 febuxostat (ULORIC) 40 MG tablet TAKE 1 TABLET DAILY 90 tablet 3  . losartan (COZAAR) 50 MG tablet TAKE 1 TABLET DAILY 30 tablet 0  . magnesium gluconate (MAGONATE) 500 MG tablet Take 500 mg by mouth daily.    Marland Kitchen omeprazole (PRILOSEC) 20 MG capsule TAKE 1 CAPSULE DAILY 30 capsule 0  . predniSONE (DELTASONE) 1 MG tablet TAKE 4 TABLETS BY MOUTH ONCE DAILY AS DIRECTED REDUCE  AS  PER  INSTRUCTIONS  0  . sildenafil (VIAGRA) 100 MG tablet Take 1 tablet (100 mg total) by  mouth daily as needed for erectile dysfunction. 10 tablet 5  . zolpidem (AMBIEN) 5 MG tablet Take 1 tablet (5 mg total) by mouth at bedtime as needed for sleep. 10 tablet 2  . atorvastatin (LIPITOR) 10 MG tablet Take 1 tablet (10 mg total) by mouth daily. 90 tablet 3  . predniSONE (DELTASONE) 5 MG tablet Take 5 mg by mouth daily.    . Tdap (BOOSTRIX) 5-2.5-18.5 LF-MCG/0.5 injection Inject 0.5 mLs into the muscle once for 1 dose. 0.5 mL 0   No current facility-administered medications for this visit.      Objective:  BP 110/68   Pulse 74   Temp 97.7 F (36.5 C) (Temporal)   Ht 6\' 4"  (1.93 m)   Wt 196 lb 6.4 oz (89.1 kg)   SpO2 97%   BMI 23.91 kg/m  Gen: NAD, resting comfortably CV: RRR no murmurs rubs or gallops Lungs: CTAB no crackles, wheeze, rhonchi Abdomen: soft/nontender/nondistended Ext: no edema Skin: warm, dry    Assessment and Plan   #hyperlipidemia S: no current rx. Had been on atorvastatin in the past through 09/2017. Had been off due to PMR but did not notice a change in symptoms Lab Results  Component Value Date   CHOL 195 09/09/2019   HDL 51.30 09/09/2019   LDLCALC 130 (H) 09/09/2019   LDLDIRECT 76.0 06/28/2013   TRIG 68.0 09/09/2019   CHOLHDL 4 09/09/2019  A/P: hopefully we can control with atorvastatin 10mg - we will repeat in 6-12 months and target LDL at least under 100 with ideal #s under 70.   # hyperglycemia/preDiabetes S:  Has been on prednisone which likely increases risk Lab Results  Component Value Date   HGBA1C 5.9 09/09/2019   HGBA1C 6.0 07/13/2014   HGBA1C 5.8 12/10/2010   A/P: thankfully a1c has not substantially increased- continue to monitor every 6-12 months at least  #Gout S: 0 flares recently on uloric 40mg  On allopurinol 300mg  was still having gout flares as of 2015.  A/P: Stable. Continue current medications.   # history of known BPH- had been on finasteride and PSA appropriately went up. He would like to do one more psa for  screening. Nocturia at least once a night Lab Results  Component Value Date   PSA 0.72 05/13/2016   PSA 0.24 07/13/2014   PSA 0.39 06/28/2013   # insomnia S:ambien working well with travel- has not had to use lately with covid 19  A/P: Stable. Continue current medications.    #hypertension/CKD III S: compliant with  losartan 50mg   GFR stable on labs in high 60s. Avoids nephrotoxic agents- last ibuprofen 3 years ago BP Readings from Last 3 Encounters:  09/13/19 110/68  05/12/18 120/74  07/17/17 118/70  A/P: BP and GFR stable- continue current meds  #  PMR S:got off prednisone in may then symptoms worsened- early September went back on. On 5 mg prednisone and working with [facility] physician. He is going to start tapering in future.  A/P: slight worsening recently- working with 07/13/18 MD on this   #neuropathy bottom of feet- stable to slightly worse. b12 and tsh were normal. Does not want to pursue neurology at the moment   Recommended follow up:  6 months with me or your [facility] physician Future Appointments  Date Time Provider Department Center  09/14/2019  2:45 PM [facility] None   Lab/Order associations:   ICD-10-CM   1. Hyperlipidemia, unspecified hyperlipidemia type  E78.5 CANCELED: Cologuard  2. Gout of multiple sites, unspecified cause, unspecified chronicity  M10.9 Uric Acid  3. Polymyalgia rheumatica (HCC)  M35.3   4. Essential hypertension  I10   5. Stage 3 chronic kidney disease, unspecified whether stage 3a or 3b CKD  N18.30   6. BPH associated with nocturia  N40.1    R35.1   7. Impaired fasting glucose  R73.01   8. Screening for colon cancer  Z12.11 Cologuard  9. Need for Tdap vaccination  Z23 Tdap (BOOSTRIX) 5-2.5-18.5 LF-MCG/0.5 injection  10. Need for immunization against influenza  Z23 Flu Vaccine QUAD High Dose(Fluad)  11. Nocturia  R35.1 PSA    Meds ordered this encounter  Medications  . Tdap (BOOSTRIX) 5-2.5-18.5 LF-MCG/0.5  injection    Sig: Inject 0.5 mLs into the muscle once for 1 dose.    Dispense:  0.5 mL    Refill:  0  . atorvastatin (LIPITOR) 10 MG tablet    Sig: Take 1 tablet (10 mg total) by mouth daily.    Dispense:  90 tablet    Refill:  3    Return precautions advised.  13/08/2019, MD

## 2019-09-13 NOTE — Patient Instructions (Addendum)
Health Maintenance Due  Topic Date Due  . TETANUS/TDAP script given to get at pharmacy  11/04/2017  . COLONOSCOPY- Cologuard ordered today  03/02/2018  . INFLUENZA VACCINE will get today  06/05/2019   Start atorvastatin 10mg  each evening  Please stop by lab before you go If you do not have mychart- we will call you about results within 5 business days of receiving them.  If you have mychart- we will send your results within 3 business days of [place] receiving them.  If abnormal or we want to clarify a result, we will call or mychart you to make sure you receive the message.  If you have questions or concerns or Guenther't hear within 5-7 days, please send [place] a message or call us.      Influenza (Flu) Vaccine (Inactivated or Recombinant): What You Need to Know 1. Why get vaccinated? Influenza vaccine can prevent influenza (flu). Flu is a contagious disease that spreads around the [place] every year, usually between October and May. Anyone can get the flu, but it is more dangerous for some people. Infants and young children, people 19 years of age and older, pregnant women, and people with certain health conditions or a weakened immune system are at greatest risk of flu complications. Pneumonia, bronchitis, sinus infections and ear infections are examples of flu-related complications. If you have a medical condition, such as heart disease, cancer or diabetes, flu can make it worse. Flu can cause fever and chills, sore throat, muscle aches, fatigue, cough, headache, and runny or stuffy nose. Some people may have vomiting and diarrhea, though this is more common in children than adults. Each year thousands of people in the [place] die from flu, and many more are hospitalized. Flu vaccine prevents millions of illnesses and flu-related visits to the doctor each year. 2. Influenza vaccine CDC recommends everyone 84 months of age and older get vaccinated every flu season. Children 6 months  through 74 years of age may need 2 doses during a single flu season. Everyone else needs only 1 dose each flu season. It takes about 2 weeks for protection to develop after vaccination. There are many flu viruses, and they are always changing. Each year a new flu vaccine is made to protect against three or four viruses that are likely to cause disease in the upcoming flu season. Even when the vaccine doesn't exactly match these viruses, it may still provide some protection. Influenza vaccine does not cause flu. Influenza vaccine may be given at the same time as other vaccines. 3. Talk with your health care provider Tell your vaccine provider if the person getting the vaccine:  Has had an allergic reaction after a previous dose of influenza vaccine, or has any severe, life-threatening allergies.  Has ever had Guillain-Barr Syndrome (also called GBS). In some cases, your health care provider may decide to postpone influenza vaccination to a future visit. People with minor illnesses, such as a cold, may be vaccinated. People who are moderately or severely ill should usually wait until they recover before getting influenza vaccine. Your health care provider can give you more information. 4. Risks of a vaccine reaction  Soreness, redness, and swelling where shot is given, fever, muscle aches, and headache can happen after influenza vaccine.  There may be a very small increased risk of Guillain-Barr Syndrome (GBS) after inactivated influenza vaccine (the flu shot). Young children who get the flu shot along with pneumococcal vaccine (PCV13), and/or DTaP vaccine at the  same time might be slightly more likely to have a seizure caused by fever. Tell your health care provider if a child who is getting flu vaccine has ever had a seizure. People sometimes faint after medical procedures, including vaccination. Tell your provider if you feel dizzy or have vision changes or ringing in the ears. As with any  medicine, there is a very remote chance of a vaccine causing a severe allergic reaction, other serious injury, or death. 5. What if there is a serious problem? An allergic reaction could occur after the vaccinated person leaves the clinic. If you see signs of a severe allergic reaction (hives, swelling of the face and throat, difficulty breathing, a fast heartbeat, dizziness, or weakness), call 9-1-1 and get the person to the nearest hospital. For other signs that concern you, call your health care provider. Adverse reactions should be reported to the Vaccine Adverse Event Reporting System (VAERS). Your health care provider will usually file this report, or you can do it yourself. Visit the VAERS website at www.vaers.SamedayNews.es or call (619)029-9494.VAERS is only for reporting reactions, and VAERS staff do not give medical advice. 6. The National Vaccine Injury Compensation Program The [employer] Vaccine Injury Compensation Program (VICP) is a federal program that was created to compensate people who may have been injured by certain vaccines. Visit the VICP website at GoldCloset.com.ee or call 508-249-5606 to learn about the program and about filing a claim. There is a time limit to file a claim for compensation. 7. How can I learn more?  Ask your healthcare provider.  Call your local or state health department.  Contact the Centers for Disease Control and Prevention (CDC): ? Call 224-374-4495 (1-800-CDC-INFO) or ? Visit CDC's https://gibson.com/ Vaccine Information Statement (Interim) Inactivated Influenza Vaccine (06/18/2018) This information is not intended to replace advice given to you by your health care provider. Make sure you discuss any questions you have with your health care provider. Document Released: 08/15/2006 Document Revised: 02/09/2019 Document Reviewed: 06/22/2018 Elsevier Patient Education  2020 [employer].

## 2019-09-13 NOTE — Assessment & Plan Note (Signed)
S: no current rx. Had been on atorvastatin in the past through 09/2017. Had been off due to PMR but did not notice a change in symptoms Lab Results  Component Value Date   CHOL 195 09/09/2019   HDL 51.30 09/09/2019   LDLCALC 130 (H) 09/09/2019   LDLDIRECT 76.0 06/28/2013   TRIG 68.0 09/09/2019   CHOLHDL 4 09/09/2019   A/P: hopefully we can control with atorvastatin 10mg - we will repeat in 6-12 months and target LDL at least under 100 with ideal #s under 70.

## 2019-09-14 ENCOUNTER — Encounter (INDEPENDENT_AMBULATORY_CARE_PROVIDER_SITE_OTHER): Payer: Self-pay | Admitting: Otolaryngology

## 2019-09-14 ENCOUNTER — Ambulatory Visit (INDEPENDENT_AMBULATORY_CARE_PROVIDER_SITE_OTHER): Payer: Medicare Other | Admitting: Otolaryngology

## 2019-09-14 VITALS — Temp 97.5°F

## 2019-09-14 DIAGNOSIS — H903 Sensorineural hearing loss, bilateral: Secondary | ICD-10-CM | POA: Diagnosis not present

## 2019-09-14 NOTE — Progress Notes (Signed)
HPI: [name] is a 73 y.o. male who presents for evaluation of hearing loss. Patient states that over the past year he has noticed his hearing gradually decrease. He endorses some ringing as well. He denies any history of loud noise exposure or trauma to the ears. He denies history of ear infections. He denies any ear pain or ear drainage. He has never had a hearing test before.  Past Medical History:  Diagnosis Date  . Arthritis   . BPH (benign prostatic hyperplasia)   . Hypertension   . Sleep apnea    Past Surgical History:  Procedure Laterality Date  . COLONOSCOPY     x2  . KNEE ARTHROSCOPY     Left  . MASS EXCISION  11/20/2012   Procedure: EXCISION MASS;  Surgeon: Alta Corning, MD;  Location: [facility];  Service: Orthopedics;  Laterality: Right;  excision right elbow gout and bursectomy  . OLECRANON BURSECTOMY  11/20/2012   Procedure: OLECRANON BURSA;  Surgeon: Alta Corning, MD;  Location: [facility];  Service: Orthopedics;  Laterality: Left;  EXCISION LEFT ELBOW GOUT AND BURSECTOMY   . WRIST ARTHROSCOPY  11/20/2012   Procedure: ARTHROSCOPY WRIST;  Surgeon: Alta Corning, MD;  Location: [facility];  Service: Orthopedics;  Laterality: Right;  RIGHT WRIST ARTHROSCOPY WITH DEBRIDEMENT OF TRIANGULAR FIBROCARTLIDGE TEAR AND SCAPHOLUNATE    Social History   Socioeconomic History  . Marital status: Widowed    Spouse name: Not on file  . Number of children: Not on file  . Years of education: Not on file  . Highest education level: Not on file  Occupational History  . Not on file  Social Needs  . Financial resource strain: Not on file  . Food insecurity    Worry: Not on file    Inability: Not on file  . Transportation needs    Medical: Not on file    Non-medical: Not on file  Tobacco Use  . Smoking status: Never Smoker  . Smokeless tobacco: Never Used  Substance and Sexual Activity  . Alcohol use: Yes    Alcohol/week: 0.0  standard drinks    Comment: 6 max per week, cutting down closer to 1 due to gout  . Drug use: No  . Sexual activity: Not on file  Lifestyle  . Physical activity    Days per week: Not on file    Minutes per session: Not on file  . Stress: Not on file  Relationships  . Social Herbalist on phone: Not on file    Gets together: Not on file    Attends religious service: Not on file    Active member of club or organization: Not on file    Attends meetings of clubs or organizations: Not on file    Relationship status: Not on file  Other Topics Concern  . Not on file  Social History Narrative   Widowed after 44-44 years of marriage (wife kidney cancer November 2016) 2 kids. 4 grandkids. 2 in [place], 2 in [place].       Self employed (semi retired)-engineer      Hobbies: tennis, boating (lives 1/2 time in [place] and 1/2 time here)   Family History  Problem Relation Age of Onset  . Heart disease Mother        CABG age 56, passed 33  . Stroke Father        mini strokes 80s  .  Diabetes Father   . Multiple myeloma Brother 60       s/p stem cell transplant  . Cancer Brother 56       pancreatic , died 41   No Known Allergies Prior to Admission medications   Medication Sig Start Date End Date Taking? Authorizing Provider  atorvastatin (LIPITOR) 10 MG tablet Take 1 tablet (10 mg total) by mouth daily. 09/13/19  Yes Marin Olp, MD  febuxostat (ULORIC) 40 MG tablet TAKE 1 TABLET DAILY 03/11/19  Yes Marin Olp, MD  losartan (COZAAR) 50 MG tablet TAKE 1 TABLET DAILY 04/09/19  Yes Marin Olp, MD  magnesium gluconate (MAGONATE) 500 MG tablet Take 500 mg by mouth daily.   Yes [provider]  omeprazole (PRILOSEC) 20 MG capsule TAKE 1 CAPSULE DAILY 04/09/19  Yes Marin Olp, MD  predniSONE (DELTASONE) 1 MG tablet TAKE 4 TABLETS BY MOUTH ONCE DAILY AS DIRECTED REDUCE  AS  PER  INSTRUCTIONS 03/29/18  Yes [provider]  predniSONE (DELTASONE) 5 MG  tablet Take 5 mg by mouth daily. 07/05/19  Yes [provider]  sildenafil (VIAGRA) 100 MG tablet Take 1 tablet (100 mg total) by mouth daily as needed for erectile dysfunction. 04/22/17  Yes Marin Olp, MD  zolpidem (AMBIEN) 5 MG tablet Take 1 tablet (5 mg total) by mouth at bedtime as needed for sleep. 10/08/18  Yes Marin Olp, MD     Positive ROS: positive for hearing loss; negative for ear pain or ear drainage.  All other systems have been reviewed and were otherwise negative with the exception of those mentioned in the HPI and as above.  Physical Exam: General: Alert, no acute distress Ears: Ear canals are clear bilaterally with intact, clear TMs. Audiogram revealed normal sloping to moderate high frequency SNHL in the right ear with normal hearing sloping to severe high frequency SNHL in the left ear. SRTs are 20 dB in the right and 25 dB in the left. Type A tympanograms bilaterally. Nasal: Clear nasal passages Oral: Clear oropharynx Neck: No palpable adenopathy or masses  Procedures  Assessment: Sensorineural hearing loss, bilateral  Plan: Audiogram reviewed with patient. He would benefit from amplification if he elects to proceed with hearing aids. He will return as needed.  Christin Hoffstadt, PA-C  I agree with the assessment and plan as outlined above. Radene Journey, MD

## 2019-09-17 ENCOUNTER — Encounter (INDEPENDENT_AMBULATORY_CARE_PROVIDER_SITE_OTHER): Payer: Self-pay

## 2019-09-19 ENCOUNTER — Encounter: Payer: Self-pay | Admitting: Family Medicine

## 2019-09-20 ENCOUNTER — Other Ambulatory Visit: Payer: Self-pay

## 2019-09-20 DIAGNOSIS — R972 Elevated prostate specific antigen [PSA]: Secondary | ICD-10-CM

## 2019-10-04 LAB — COLOGUARD: Cologuard: NEGATIVE

## 2019-10-18 ENCOUNTER — Other Ambulatory Visit: Payer: Self-pay

## 2019-10-18 ENCOUNTER — Ambulatory Visit (INDEPENDENT_AMBULATORY_CARE_PROVIDER_SITE_OTHER): Payer: Medicare Other

## 2019-10-18 DIAGNOSIS — Z0000 Encounter for general adult medical examination without abnormal findings: Secondary | ICD-10-CM | POA: Diagnosis not present

## 2019-10-18 NOTE — Patient Instructions (Signed)
[name] , Thank you for taking time to come for your Medicare Wellness Visit. I appreciate your ongoing commitment to your health goals. Please review the following plan we discussed and let me know if I can assist you in the future.   Screening recommendations/referrals: Colorectal Screening: up to date; Cologuard normal 10/04/19  Vision and Dental Exams: Recommended annual ophthalmology exams for early detection of glaucoma and other disorders of the eye Recommended annual dental exams for proper oral hygiene  Vaccinations: Influenza vaccine: completed 09/13/19 Pneumococcal vaccine: up to date; last 05/17/16 Tdap vaccine: recommended; Please call your insurance company to determine your out of pocket expense. You may receive this vaccine at your local pharmacy or Health Dept. Shingles vaccine: Shingrix completed   Advanced directives: Please bring a copy of your POA (Power of Attorney) and/or Living Will to your next appointment.  Goals: Recommend to drink at least 6-8 8oz glasses of water per day and consume a balanced diet rich in fresh fruits.   Next appointment: Please schedule your Annual Wellness Visit with your Nurse Health Advisor in one year.  Preventive Care 72 Years and Older, Male Preventive care refers to lifestyle choices and visits with your health care provider that can promote health and wellness. What does preventive care include?  A yearly physical exam. This is also called an annual well check.  Dental exams once or twice a year.  Routine eye exams. Ask your health care provider how often you should have your eyes checked.  Personal lifestyle choices, including:  Daily care of your teeth and gums.  Regular physical activity.  Eating a healthy diet.  Avoiding tobacco and drug use.  Limiting alcohol use.  Practicing safe sex.  Taking low doses of aspirin every day if recommended by your health care provider..  Taking vitamin and mineral  supplements as recommended by your health care provider. What happens during an annual well check? The services and screenings done by your health care provider during your annual well check will depend on your age, overall health, lifestyle risk factors, and family history of disease. Counseling  Your health care provider may ask you questions about your:  Alcohol use.  Tobacco use.  Drug use.  Emotional well-being.  Home and relationship well-being.  Sexual activity.  Eating habits.  History of falls.  Memory and ability to understand (cognition).  Work and work Statistician. Screening  You may have the following tests or measurements:  Height, weight, and BMI.  Blood pressure.  Lipid and cholesterol levels. These may be checked every 5 years, or more frequently if you are over 59 years old.  Skin check.  Lung cancer screening. You may have this screening every year starting at age 63 if you have a 30-pack-year history of smoking and currently smoke or have quit within the past 15 years.  Fecal occult blood test (FOBT) of the stool. You may have this test every year starting at age 55.  Flexible sigmoidoscopy or colonoscopy. You may have a sigmoidoscopy every 5 years or a colonoscopy every 10 years starting at age 74.  Prostate cancer screening. Recommendations will vary depending on your family history and other risks.  Hepatitis C blood test.  Hepatitis B blood test.  Sexually transmitted disease (STD) testing.  Diabetes screening. This is done by checking your blood sugar (glucose) after you have not eaten for a while (fasting). You may have this done every 1-3 years.  Abdominal aortic aneurysm (AAA) screening. You may  need this if you are a current or former smoker.  Osteoporosis. You may be screened starting at age 5 if you are at high risk. Talk with your health care provider about your test results, treatment options, and if necessary, the need for more  tests. Vaccines  Your health care provider may recommend certain vaccines, such as:  Influenza vaccine. This is recommended every year.  Tetanus, diphtheria, and acellular pertussis (Tdap, Td) vaccine. You may need a Td booster every 10 years.  Zoster vaccine. You may need this after age 14.  Pneumococcal 13-valent conjugate (PCV13) vaccine. One dose is recommended after age 76.  Pneumococcal polysaccharide (PPSV23) vaccine. One dose is recommended after age 8. Talk to your health care provider about which screenings and vaccines you need and how often you need them. This information is not intended to replace advice given to you by your health care provider. Make sure you discuss any questions you have with your health care provider. Document Released: 11/17/2015 Document Revised: 07/10/2016 Document Reviewed: 08/22/2015 Elsevier Interactive Patient Education  2017 [place] Prevention in the Home Falls can cause injuries. They can happen to people of all ages. There are many things you can do to make your home safe and to help prevent falls. What can I do on the outside of my home?  Regularly fix the edges of walkways and driveways and fix any cracks.  Remove anything that might make you trip as you walk through a door, such as a raised step or threshold.  Trim any bushes or trees on the path to your home.  Use bright outdoor lighting.  Clear any walking paths of anything that might make someone trip, such as rocks or tools.  Regularly check to see if handrails are loose or broken. Make sure that both sides of any steps have handrails.  Any raised decks and porches should have guardrails on the edges.  Have any leaves, snow, or ice cleared regularly.  Use sand or salt on walking paths during winter.  Clean up any spills in your garage right away. This includes oil or grease spills. What can I do in the bathroom?  Use night lights.  Install grab bars by the  toilet and in the tub and shower. Do not use towel bars as grab bars.  Use non-skid mats or decals in the tub or shower.  If you need to sit down in the shower, use a plastic, non-slip stool.  Keep the floor dry. Clean up any water that spills on the floor as soon as it happens.  Remove soap buildup in the tub or shower regularly.  Attach bath mats securely with double-sided non-slip rug tape.  Do not have throw rugs and other things on the floor that can make you trip. What can I do in the bedroom?  Use night lights.  Make sure that you have a light by your bed that is easy to reach.  Do not use any sheets or blankets that are too big for your bed. They should not hang down onto the floor.  Have a firm chair that has side arms. You can use this for support while you get dressed.  Do not have throw rugs and other things on the floor that can make you trip. What can I do in the kitchen?  Clean up any spills right away.  Avoid walking on wet floors.  Keep items that you use a lot in easy-to-reach places.  If  you need to reach something above you, use a strong step stool that has a grab bar.  Keep electrical cords out of the way.  Do not use floor polish or wax that makes floors slippery. If you must use wax, use non-skid floor wax.  Do not have throw rugs and other things on the floor that can make you trip. What can I do with my stairs?  Do not leave any items on the stairs.  Make sure that there are handrails on both sides of the stairs and use them. Fix handrails that are broken or loose. Make sure that handrails are as long as the stairways.  Check any carpeting to make sure that it is firmly attached to the stairs. Fix any carpet that is loose or worn.  Avoid having throw rugs at the top or bottom of the stairs. If you do have throw rugs, attach them to the floor with carpet tape.  Make sure that you have a light switch at the top of the stairs and the bottom of  the stairs. If you do not have them, ask someone to add them for you. What else can I do to help prevent falls?  Wear shoes that:  Do not have high heels.  Have rubber bottoms.  Are comfortable and fit you well.  Are closed at the toe. Do not wear sandals.  If you use a stepladder:  Make sure that it is fully opened. Do not climb a closed stepladder.  Make sure that both sides of the stepladder are locked into place.  Ask someone to hold it for you, if possible.  Clearly mark and make sure that you can see:  Any grab bars or handrails.  First and last steps.  Where the edge of each step is.  Use tools that help you move around (mobility aids) if they are needed. These include:  Canes.  Walkers.  Scooters.  Crutches.  Turn on the lights when you go into a dark area. Replace any light bulbs as soon as they burn out.  Set up your furniture so you have a clear path. Avoid moving your furniture around.  If any of your floors are uneven, fix them.  If there are any pets around you, be aware of where they are.  Review your medicines with your doctor. Some medicines can make you feel dizzy. This can increase your chance of falling. Ask your doctor what other things that you can do to help prevent falls. This information is not intended to replace advice given to you by your health care provider. Make sure you discuss any questions you have with your health care provider. Document Released: 08/17/2009 Document Revised: 03/28/2016 Document Reviewed: 11/25/2014 Elsevier Interactive Patient Education  2017 [employer].

## 2019-10-18 NOTE — Progress Notes (Signed)
This visit is being conducted via phone call due to the COVID-19 pandemic. This patient has given me verbal consent via phone to conduct this visit, patient states they are participating from their home address. Some vital signs may be absent or patient reported.   Patient identification: identified by name, DOB, and current address.  Location provider: [facility], Office Persons participating in the virtual visit: Denman George LPN, patient, Dr. Garret Reddish     Subjective:   [name] is a 73 y.o. male who presents for Medicare Annual/Subsequent preventive examination.  Review of Systems:   Cardiac Risk Factors include: male gender;hypertension;advanced age (>18mn, >>67women);dyslipidemia    Objective:    Vitals: There were no vitals taken for this visit.  There is no height or weight on file to calculate BMI.  Advanced Directives 10/18/2019 11/19/2012  Does Patient Have a Medical Advance Directive? Yes Patient has advance directive, copy not in chart  Type of Advance Directive Living will;Healthcare Power of Attorney -  Does patient want to make changes to medical advance directive? No - Patient declined -  Copy of HIsabelain Chart? No - copy requested -    Tobacco Social History   Tobacco Use  Smoking Status Never Smoker  Smokeless Tobacco Never Used     Counseling given: Not Answered   Clinical Intake:  Pre-visit preparation completed: Yes  Pain : No/denies pain  Diabetes: No  How often do you need to have someone help you when you read instructions, pamphlets, or other written materials from your doctor or pharmacy?: 1 - Never  Interpreter Needed?: No  Information entered by :: CDenman GeorgeLPN  Past Medical History:  Diagnosis Date  . Arthritis   . BPH (benign prostatic hyperplasia)   . Hypertension   . Sleep apnea    Past Surgical History:  Procedure Laterality Date  . COLONOSCOPY     x2  . KNEE ARTHROSCOPY     Left  . MASS EXCISION  11/20/2012   Procedure: EXCISION MASS;  Surgeon: JAlta Corning MD;  Location: [facility]  Service: Orthopedics;  Laterality: Right;  excision right elbow gout and bursectomy  . OLECRANON BURSECTOMY  11/20/2012   Procedure: OLECRANON BURSA;  Surgeon: JAlta Corning MD;  [facility]  Service: Orthopedics;  Laterality: Left;  EXCISION LEFT ELBOW GOUT AND BURSECTOMY   . WRIST ARTHROSCOPY  11/20/2012   Procedure: ARTHROSCOPY WRIST;  Surgeon: JAlta Corning MD;  Location[facility]  Service: Orthopedics;  Laterality: Right;  RIGHT WRIST ARTHROSCOPY WITH DEBRIDEMENT OF TRIANGULAR FIBROCARTLIDGE TEAR AND SCAPHOLUNATE    Family History  Problem Relation Age of Onset  . Heart disease Mother        CABG age 72 passed 828 . Stroke Father        mini strokes 80s  . Diabetes Father   . Multiple myeloma Brother 60       s/p stem cell transplant  . Cancer Brother 340      pancreatic , died 359  Social History   Socioeconomic History  . Marital status: Widowed    Spouse name: Not on file  . Number of children: Not on file  . Years of education: Not on file  . Highest education level: Not on file  Occupational History  . Not on file  Tobacco Use  . Smoking status: Never Smoker  . Smokeless tobacco:  Never Used  Substance and Sexual Activity  . Alcohol use: Yes    Alcohol/week: 0.0 standard drinks    Comment: 6 max per week, cutting down closer to 1 due to gout  . Drug use: No  . Sexual activity: Not on file  Other Topics Concern  . Not on file  Social History Narrative   Widowed after 91-44 years of marriage (wife kidney cancer November 2016) 2 kids. 4 grandkids. 2 in [place], 2 in [place].       Self employed (semi retired)-engineer      Hobbies: tennis, boating (lives 1/2 time in [place] and 1/2 time here)   Social Determinants of Health   Financial Resource Strain:   . Difficulty of Paying Living Expenses:  Not on file  Food Insecurity:   . Worried About Charity fundraiser in the Last Year: Not on file  . Ran Out of Food in the Last Year: Not on file  Transportation Needs:   . Lack of Transportation (Medical): Not on file  . Lack of Transportation (Non-Medical): Not on file  Physical Activity:   . Days of Exercise per Week: Not on file  . Minutes of Exercise per Session: Not on file  Stress:   . Feeling of Stress : Not on file  Social Connections:   . Frequency of Communication with Friends and Family: Not on file  . Frequency of Social Gatherings with Friends and Family: Not on file  . Attends Religious Services: Not on file  . Active Member of Clubs or Organizations: Not on file  . Attends Archivist Meetings: Not on file  . Marital Status: Not on file    Outpatient Encounter Medications as of 10/18/2019  Medication Sig  . atorvastatin (LIPITOR) 10 MG tablet Take 1 tablet (10 mg total) by mouth daily.  . febuxostat (ULORIC) 40 MG tablet TAKE 1 TABLET DAILY  . losartan (COZAAR) 50 MG tablet TAKE 1 TABLET DAILY  . magnesium gluconate (MAGONATE) 500 MG tablet Take 500 mg by mouth daily.  Marland Kitchen omeprazole (PRILOSEC) 20 MG capsule TAKE 1 CAPSULE DAILY  . predniSONE (DELTASONE) 1 MG tablet TAKE 4 TABLETS BY MOUTH ONCE DAILY AS DIRECTED REDUCE  AS  PER  INSTRUCTIONS  . predniSONE (DELTASONE) 5 MG tablet Take 5 mg by mouth daily.  . sildenafil (VIAGRA) 100 MG tablet Take 1 tablet (100 mg total) by mouth daily as needed for erectile dysfunction.  Marland Kitchen zolpidem (AMBIEN) 5 MG tablet Take 1 tablet (5 mg total) by mouth at bedtime as needed for sleep.   No facility-administered encounter medications on file as of 10/18/2019.    Activities of Daily Living In your present state of health, do you have any difficulty performing the following activities: 10/18/2019 09/13/2019  Hearing? N N  Vision? N N  Difficulty concentrating or making decisions? N N  Walking or climbing stairs? N N    Dressing or bathing? N N  Doing errands, shopping? N N  Preparing Food and eating ? N -  Using the Toilet? N -  In the past six months, have you accidently leaked urine? N -  Do you have problems with loss of bowel control? N -  Managing your Medications? N -  Managing your Finances? N -  Housekeeping or managing your Housekeeping? N -  Some recent data might be hidden    Patient Care Team: Marin Olp, MD as PCP - General (Family Medicine)   Assessment:  This is a routine wellness examination for [facility].  Exercise Activities and Dietary recommendations Current Exercise Habits: Home exercise routine, Type of exercise: Other - see comments;walking(outside activities), Time (Minutes): 30, Frequency (Times/Week): 5, Weekly Exercise (Minutes/Week): 150, Intensity: Mild  Goals    . Blood Pressure < 140/90     Discussed goals       Fall Risk Fall Risk  10/18/2019 05/12/2018 05/17/2016 12/29/2013  Falls in the past year? 0 No No No  Injury with Fall? 0 - - -  Follow up Falls evaluation completed;Education provided;Falls prevention discussed - - -   Is the patient's home free of loose throw rugs in walkways, pet beds, electrical cords, etc?   yes      Grab bars in the bathroom? yes      Handrails on the stairs?   yes      Adequate lighting?   yes  Depression Screen PHQ 2/9 Scores 10/18/2019 09/13/2019 05/12/2018 05/17/2016  PHQ - 2 Score 0 0 0 0  PHQ- 9 Score - 0 - -    Cognitive Function- no cognitive concerns at this time  Cognitive Testing  Alert? Yes         Normal Appearance? N/a  Oriented to person? Yes           Place? Yes  Time? Yes  Recall of three objects? Yes  Can perform simple calculations? Yes  Displays appropriate judgment? Yes  Can read the correct time from a watch face? Yes   Immunization History  Administered Date(s) Administered  . Fluad Quad(high Dose 65+) 09/13/2019  . Influenza Split 11/18/2012  . Influenza Whole 12/09/2009  . Influenza,inj,Quad  PF,6+ Mos 06/28/2013, 06/30/2014  . Influenza-Unspecified 07/05/2016  . Pneumococcal Conjugate-13 05/17/2016  . Pneumococcal Polysaccharide-23 11/18/2012  . Td 11/05/2007  . Zoster 11/04/2010  . Zoster Recombinat (Shingrix) 10/23/2018, 05/29/2019    Qualifies for Shingles Vaccine? Shingrix completed   Screening Tests Health Maintenance  Topic Date Due  . TETANUS/TDAP  11/04/2017  . Fecal DNA (Cologuard)  09/24/2022  . INFLUENZA VACCINE  Completed  . Hepatitis C Screening  Completed  . PNA vac Low Risk Adult  Completed   Cancer Screenings: Lung: Low Dose CT Chest recommended if Age 53-80 years, 30 pack-year currently smoking OR have quit w/in 15years. Patient does not qualify. Colorectal: Cologuard normal 10/04/19      Plan:  I have personally reviewed and addressed the Medicare Annual Wellness questionnaire and have noted the following in the patient's chart:  A. Medical and social history B. Use of alcohol, tobacco or illicit drugs  C. Current medications and supplements D. Functional ability and status E.  Nutritional status F.  Physical activity G. Advance directives H. List of other physicians I.  Hospitalizations, surgeries, and ER visits in previous 12 months J.  [place] such as hearing and vision if needed, cognitive and depression L. Referrals, records requested, and appointments- none   In addition, I have reviewed and discussed with patient certain preventive protocols, quality metrics, and best practice recommendations. A written personalized care plan for preventive services as well as general preventive health recommendations were provided to patient.   Signed,  Denman George, LPN  Nurse Health Advisor   Nurse Notes: no additional

## 2019-11-09 ENCOUNTER — Encounter: Payer: Self-pay | Admitting: Family Medicine

## 2019-11-10 ENCOUNTER — Other Ambulatory Visit: Payer: Self-pay

## 2019-11-11 ENCOUNTER — Other Ambulatory Visit (INDEPENDENT_AMBULATORY_CARE_PROVIDER_SITE_OTHER): Payer: Medicare Other

## 2019-11-11 DIAGNOSIS — R972 Elevated prostate specific antigen [PSA]: Secondary | ICD-10-CM | POA: Diagnosis not present

## 2019-11-11 LAB — PSA: PSA: 1.13 ng/mL (ref 0.10–4.00)

## 2020-02-05 ENCOUNTER — Other Ambulatory Visit: Payer: Self-pay | Admitting: Family Medicine

## 2020-02-24 ENCOUNTER — Encounter: Payer: Self-pay | Admitting: Family Medicine

## 2020-02-25 NOTE — Telephone Encounter (Signed)
LVM for the patient x2 to call back and schedule with Dr. Durene Cal

## 2020-04-04 ENCOUNTER — Encounter: Payer: Self-pay | Admitting: Family Medicine

## 2020-04-04 ENCOUNTER — Ambulatory Visit (INDEPENDENT_AMBULATORY_CARE_PROVIDER_SITE_OTHER): Payer: Medicare Other | Admitting: Family Medicine

## 2020-04-04 ENCOUNTER — Other Ambulatory Visit: Payer: Self-pay

## 2020-04-04 ENCOUNTER — Encounter: Payer: Self-pay | Admitting: Gastroenterology

## 2020-04-04 VITALS — BP 128/64 | HR 80 | Temp 98.5°F | Ht 76.0 in | Wt 207.4 lb

## 2020-04-04 DIAGNOSIS — E785 Hyperlipidemia, unspecified: Secondary | ICD-10-CM

## 2020-04-04 DIAGNOSIS — R739 Hyperglycemia, unspecified: Secondary | ICD-10-CM

## 2020-04-04 DIAGNOSIS — M79644 Pain in right finger(s): Secondary | ICD-10-CM

## 2020-04-04 DIAGNOSIS — K2101 Gastro-esophageal reflux disease with esophagitis, with bleeding: Secondary | ICD-10-CM

## 2020-04-04 DIAGNOSIS — Z23 Encounter for immunization: Secondary | ICD-10-CM

## 2020-04-04 DIAGNOSIS — G2581 Restless legs syndrome: Secondary | ICD-10-CM

## 2020-04-04 DIAGNOSIS — M109 Gout, unspecified: Secondary | ICD-10-CM

## 2020-04-04 DIAGNOSIS — R079 Chest pain, unspecified: Secondary | ICD-10-CM

## 2020-04-04 DIAGNOSIS — I10 Essential (primary) hypertension: Secondary | ICD-10-CM

## 2020-04-04 MED ORDER — TRIAMCINOLONE ACETONIDE 0.5 % EX OINT: 1.0000 "application " | TOPICAL_OINTMENT | Freq: Two times a day (BID) | CUTANEOUS | 0 refills | Status: AC

## 2020-04-04 MED ORDER — GABAPENTIN 100 MG PO CAPS: 100.0000 mg | ORAL_CAPSULE | Freq: Every day | ORAL | 3 refills | Status: AC

## 2020-04-04 NOTE — Progress Notes (Signed)
Phone [phone] In person visit   Subjective:   [name] is a 74 y.o. year old very pleasant male patient who presents for/with See problem oriented charting Chief Complaint  Patient presents with  . Follow-up  . finger infection   This visit occurred during the SARS-CoV-2 public health emergency.  Safety protocols were in place, including screening questions prior to the visit, additional usage of staff PPE, and extensive cleaning of exam room while observing appropriate contact time as indicated for disinfecting solutions.   Past Medical History-  Patient Active Problem List   Diagnosis Date Noted  . History of polymyalgia rheumatica 03/11/2019    Priority: High  . CKD (chronic kidney disease), stage III 06/30/2014    Priority: Medium  . Hypertension 11/18/2012    Priority: Medium  . Gout 02/24/2012    Priority: Medium  . BPH associated with nocturia 10/12/2010    Priority: Medium  . Hyperlipidemia, unspecified 01/22/2008    Priority: Medium  . Impaired fasting glucose 01/22/2008    Priority: Medium  . History of skin cancer 06/30/2014    Priority: Low  . Osteoarthritis 06/30/2014    Priority: Low  . Neuropathy 03/11/2019  . Nasal septal deviation 07/18/2017  . 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 atorvastatin (LIPITOR) 10 MG tablet Take 1 tablet (10 mg total) by mouth daily. 90 tablet 3  . febuxostat (ULORIC) 40 MG tablet TAKE 1 TABLET DAILY 90 tablet 3  . losartan (COZAAR) 50 MG tablet TAKE 1 TABLET DAILY 90 tablet 3  . magnesium gluconate (MAGONATE) 500 MG tablet Take 500 mg by mouth daily.    Marland Kitchen omeprazole (PRILOSEC) 20 MG capsule TAKE 1 CAPSULE DAILY 30 capsule 0  . sildenafil (VIAGRA) 100 MG tablet Take 1 tablet (100 mg total) by mouth daily as needed for erectile dysfunction. 10 tablet 5  . gabapentin (NEURONTIN) 100 MG capsule Take 1-2 capsules  (100-200 mg total) by mouth at bedtime. 90 capsule 3  . triamcinolone ointment (KENALOG) 0.5 % Apply 1 application topically 2 (two) times daily. For 7 days to right pinky finger 15 g 0   No current facility-administered medications for this visit.      Objective:  BP 128/64   Pulse 80   Temp 98.5 F (36.9 C)   Ht 6\' 4"  (1.93 m)   Wt 207 lb 6.4 oz (94.1 kg)   SpO2 96%   BMI 25.25 kg/m  Gen: NAD, resting comfortably CV: RRR no murmurs rubs or gallops Lungs: CTAB no crackles, wheeze, rhonchi Ext: trace edema Skin: warm, dry, Moderate tenderness just past DIP joint on right hand- erythema and scaling noted. At PIP on 5th finger mild scaling.      Assessment and Plan   # Finger Infection  S: pt c/o right middle finger infection that is not getting better. He was given Keflex x1wk at an UC in [facility] and it got better with that but now it has gotten worse. 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  A/P: I am concerned patient may have a foreign body in the right middle finger-we will place a referral to hand surgery for their opinion.  He had x-rays in [place] which were negative but obviously not everything shows up on x-ray.  Also has a much smaller lesion on his right pinky finger-we are going to try  treatment with triamcinolone here-I wonder if this could be an ongoing irritant but unclear why it would persist that he is no longer working with the same materials- plant with thorns/stickers.   # Chest pain /GERD S:went to ER in [place] that specializes in heart related issues.  They did not think issues were heart related- told EKG and troponins ok. No recurrence since addiing pepcid ac- took a day off and had some recurrence less severe that improved when he restarted. He is taking omeprazole 20mg  at baseline. Recommendation was for him to see GI.   No exertional chest pain noted- no shortness of breath left arm or neck pain. Doing 10-12k stesps  a day and playing tennis and no issues  A/P: We will place referral to GI under GERD-if this takes longer than a week then he will have to get established in  Potentially- does travel here at times -also to be on safe side was told consider stress test-from his story I do not strongly suspect cardiac etiology but I think is reasonable to get a treadmill stress test since he is so active and last EKG was normal  #hyperlipidemia S: Medication: atorvastatin 10mg -has been off statin during, PMR-patient preferred to restart atorvastatin over starting rosuvastatin which was suggested by my last lab note -no obvious recurrence of PMR since january Lab Results  Component Value Date   CHOL 195 09/09/2019   HDL 51.30 09/09/2019   LDLCALC 130 (H) 09/09/2019   LDLDIRECT 76.0 06/28/2013   TRIG 68.0 09/09/2019   CHOLHDL 4 09/09/2019   A/P: Hopefully well controlled-update direct LDL with labs today-LDL goal at least under 100 prefer under 70  #hypertension S: medication:  losartan 50mg  BP Readings from Last 3 Encounters:  04/04/20 128/64  09/13/19 110/68  05/12/18 120/74  A/P: Stable. Continue current medications.   #Gout S: 0 flares in over a year on uloric 40mg  Lab Results  Component Value Date   LABURIC 6.4 09/13/2019  A/P:controlled- last attempt allopurinol was in 2014 it appears and uric acid did not get low enough on allopurinol and was transitioned to uloric- uric acid above 9 it apears on allopurinol and has been closer to 6 or below on uloric  # Hyperglycemia/insulin resistance/prediabetes S:  Medication: none Wt Readings from Last 3 Encounters:  04/04/20 207 lb 6.4 oz (94.1 kg)  09/13/19 196 lb 6.4 oz (89.1 kg)  03/11/19 203 lb (92.1 kg)  Exercise and diet- weight up 11 lbs from last visit. Exercise is down Lab Results  Component Value Date   HGBA1C 5.9 09/09/2019   HGBA1C 6.0 07/13/2014   HGBA1C 5.8 12/10/2010  A/P: Hopefully hemoglobin A1c remains controlled at 6 or  below-has had some recent weight gain and he wonders if exercise being down in 05/11/19 is contributing.  # cramps/restless legs  S:restless legs recently increased from baseline issues for last 20 years. Unclear trigger.   A/P: update labs particularly ferritin - trial gabapentin  Recommended follow up: Return in about 6 months (around 10/04/2020) for follow up- or sooner if needed.  Lab/Order associations:   ICD-10-CM   1. Pain of right middle finger  M79.644 Ambulatory referral to Hand Surgery  2. Gastroesophageal reflux disease with esophagitis and hemorrhage  K21.01 Ambulatory referral to Gastroenterology  3. Hyperlipidemia, unspecified hyperlipidemia type  E78.5 CBC with Differential/Platelet    Comprehensive metabolic panel    LDL cholesterol, direct  4. Hyperglycemia  R73.9 Hemoglobin A1c  5. Need for Tdap vaccination  Z23 Tdap vaccine greater than or equal to 7yo IM  6. Gout of multiple sites, unspecified cause, unspecified chronicity  M10.9   7. Essential hypertension  I10   8. Restless legs syndrome  G25.81 IBC + Ferritin    Magnesium  9. Chest pain, unspecified type  R07.9 Exercise Tolerance Test    Meds ordered this encounter  Medications  . triamcinolone ointment (KENALOG) 0.5 %    Sig: Apply 1 application topically 2 (two) times daily. For 7 days to right pinky finger    Dispense:  15 g    Refill:  0  . gabapentin (NEURONTIN) 100 MG capsule    Sig: Take 1-2 capsules (100-200 mg total) by mouth at bedtime.    Dispense:  90 capsule    Refill:  3   Return precautions advised.  Tana Conch, MD

## 2020-04-04 NOTE — Patient Instructions (Addendum)
Sit tight about hand surgery referral  We will call you within two weeks about your referral to GI due to GERD issues. If you do not hear within 3 weeks, give [place] a call. If you have recurrent chest pain issues seek care immediately- stay on pepcid until you see them.   Team-please complete waiver form for ferritin level for restless legs. Sign all pending orders  Please stop by lab before you go If you have mychart- we will send your results within 3 business days of [place] receiving them.  If you do not have mychart- we will call you about results within 5 business days of [place] receiving them.

## 2020-04-05 LAB — COMPREHENSIVE METABOLIC PANEL
ALT: 17 U/L (ref 0–53)
AST: 18 U/L (ref 0–37)
Albumin: 4.2 g/dL (ref 3.5–5.2)
Alkaline Phosphatase: 47 U/L (ref 39–117)
BUN: 25 mg/dL — ABNORMAL HIGH (ref 6–23)
CO2: 30 mEq/L (ref 19–32)
Calcium: 9.6 mg/dL (ref 8.4–10.5)
Chloride: 104 mEq/L (ref 96–112)
Creatinine, Ser: 1.29 mg/dL (ref 0.40–1.50)
GFR: 54.6 mL/min — ABNORMAL LOW (ref >60.00)
Glucose, Bld: 98 mg/dL (ref 70–99)
Potassium: 4.6 mEq/L (ref 3.5–5.1)
Sodium: 139 mEq/L (ref 135–145)
Total Bilirubin: 0.4 mg/dL (ref 0.2–1.2)
Total Protein: 6.2 g/dL (ref 6.0–8.3)

## 2020-04-05 LAB — CBC WITH DIFFERENTIAL/PLATELET
Basophils Absolute: 0.1 10*3/uL (ref 0.0–0.1)
Basophils Relative: 0.8 % (ref 0.0–3.0)
Eosinophils Absolute: 0.1 10*3/uL (ref 0.0–0.7)
Eosinophils Relative: 1.8 % (ref 0.0–5.0)
HCT: 38.7 % — ABNORMAL LOW (ref 39.0–52.0)
Hemoglobin: 13.3 g/dL (ref 13.0–17.0)
Lymphocytes Relative: 19.2 % (ref 12.0–46.0)
Lymphs Abs: 1.6 10*3/uL (ref 0.7–4.0)
MCHC: 34.4 g/dL (ref 30.0–36.0)
MCV: 89.6 fl (ref 78.0–100.0)
Monocytes Absolute: 1 10*3/uL (ref 0.1–1.0)
Monocytes Relative: 11.3 % (ref 3.0–12.0)
Neutro Abs: 5.7 10*3/uL (ref 1.4–7.7)
Neutrophils Relative %: 66.9 % (ref 43.0–77.0)
Platelets: 241 10*3/uL (ref 150.0–400.0)
RBC: 4.32 Mil/uL (ref 4.22–5.81)
RDW: 13.3 % (ref 11.5–15.5)
WBC: 8.5 10*3/uL (ref 4.0–10.5)

## 2020-04-05 LAB — IBC + FERRITIN
Ferritin: 183.4 ng/mL (ref 22.0–322.0)
Iron: 89 ug/dL (ref 42–165)
Saturation Ratios: 30.9 % (ref 20.0–50.0)
Transferrin: 206 mg/dL — ABNORMAL LOW (ref 212.0–360.0)

## 2020-04-05 LAB — MAGNESIUM: Magnesium: 1.9 mg/dL (ref 1.5–2.5)

## 2020-04-05 LAB — HEMOGLOBIN A1C: Hgb A1c MFr Bld: 6 % (ref 4.6–6.5)

## 2020-04-05 LAB — LDL CHOLESTEROL, DIRECT: Direct LDL: 51 mg/dL

## 2020-05-17 ENCOUNTER — Other Ambulatory Visit: Payer: Self-pay | Admitting: Family Medicine

## 2020-05-30 ENCOUNTER — Encounter: Payer: Self-pay | Admitting: Gastroenterology

## 2020-05-30 ENCOUNTER — Ambulatory Visit (INDEPENDENT_AMBULATORY_CARE_PROVIDER_SITE_OTHER): Payer: Medicare Other | Admitting: Gastroenterology

## 2020-05-30 VITALS — BP 130/60 | HR 78 | Ht 76.0 in | Wt 201.0 lb

## 2020-05-30 DIAGNOSIS — R1310 Dysphagia, unspecified: Secondary | ICD-10-CM

## 2020-05-30 DIAGNOSIS — K219 Gastro-esophageal reflux disease without esophagitis: Secondary | ICD-10-CM | POA: Diagnosis not present

## 2020-05-30 NOTE — Progress Notes (Signed)
HPI :  74 year old male with a history of GERD, hypertension, sleep apnea, referred by Garret Reddish for GERD and atypical chest pain.  Patient states he has been on omeprazole for perhaps 10 years or so.  He has had symptoms of waterbrash in the past and has been taking 20 mg a day which has worked quite well for it and prevents symptoms.  He mostly does not have any significant breakthrough unless there is some dietary indiscretion.  He states in May when he was living in [place] he ate a lot of fried fish and beans prior to dinner, woke up at about midnight with substernal chest discomfort.  This went on for most of the night and he went to the emergency room in the morning.  He had negative troponins x2, negative x-ray and EKG, it was thought that he perhaps had reflux causing his symptoms.  He is not had any recurrence of any symptoms at all.  He generally does not have much heartburn that bothers him or regurgitation.  Waterbrash under control.  Over the past few months he has had occasional dysphagia to solids that he feels in his upper throat.  No nausea or vomiting, no abdominal pains.  He was given a short course of Pepcid after his ER visit and took for 2 weeks and then stopped it.  He denies any exertional symptoms, no cardiopulmonary problems that bother him.  He denies any blood in his stools.  He had a negative Cologuard in 2020.  Last colonoscopy in 2009.  He denies any family history of esophageal, gastric, or colon cancer.  He denies any tobacco use.  He is never had an EGD for Barrett's screening.  He has had a COVID-19 vaccine.   Colonoscopy - 03/02/2008 - AVM, hemorrhoids, no polyps  Cologuard 09/25/19 - negative   Past Medical History:  Diagnosis Date  . Arthritis   . BPH (benign prostatic hyperplasia)   . GERD (gastroesophageal reflux disease)   . Hypertension   . Sleep apnea      Past Surgical History:  Procedure Laterality Date  . COLONOSCOPY     x2  . KNEE  ARTHROSCOPY     Left  . MASS EXCISION  11/20/2012   Procedure: EXCISION MASS;  Surgeon: Alta Corning, MD;  Location: [facility];  Service: Orthopedics;  Laterality: Right;  excision right elbow gout and bursectomy  . OLECRANON BURSECTOMY  11/20/2012   Procedure: OLECRANON BURSA;  Surgeon: Alta Corning, MD;  Location: [facility];  Service: Orthopedics;  Laterality: Left;  EXCISION LEFT ELBOW GOUT AND BURSECTOMY   . WRIST ARTHROSCOPY  11/20/2012   Procedure: ARTHROSCOPY WRIST;  Surgeon: Alta Corning, MD;  Location: [facility];  Service: Orthopedics;  Laterality: Right;  RIGHT WRIST ARTHROSCOPY WITH DEBRIDEMENT OF TRIANGULAR FIBROCARTLIDGE TEAR AND SCAPHOLUNATE    Family History  Problem Relation Age of Onset  . Heart disease Mother        CABG age 59, passed 1  . Stroke Father        mini strokes 80s  . Diabetes Father   . Multiple myeloma Brother 60       s/p stem cell transplant  . Cancer Brother 5       pancreatic , died 45   Social History   Tobacco Use  . Smoking status: Never Smoker  . Smokeless tobacco: Never Used  Vaping Use  . Vaping Use: Never used  Substance Use Topics  . Alcohol use: Yes    Alcohol/week: 0.0 standard drinks    Comment: 6 max per week, cutting down closer to 1 due to gout  . Drug use: No   Current Outpatient Medications  Medication Sig Dispense Refill  . atorvastatin (LIPITOR) 10 MG tablet Take 1 tablet (10 mg total) by mouth daily. 90 tablet 3  . febuxostat (ULORIC) 40 MG tablet TAKE 1 TABLET DAILY 90 tablet 0  . gabapentin (NEURONTIN) 100 MG capsule Take 1-2 capsules (100-200 mg total) by mouth at bedtime. 90 capsule 3  . losartan (COZAAR) 50 MG tablet TAKE 1 TABLET DAILY 90 tablet 3  . magnesium gluconate (MAGONATE) 500 MG tablet Take 500 mg by mouth daily.    Marland Kitchen omeprazole (PRILOSEC) 20 MG capsule TAKE 1 CAPSULE DAILY 30 capsule 0  . sildenafil (VIAGRA) 100 MG tablet Take 1 tablet (100 mg total) by  mouth daily as needed for erectile dysfunction. 10 tablet 5  . triamcinolone ointment (KENALOG) 0.5 % Apply 1 application topically 2 (two) times daily. For 7 days to right pinky finger 15 g 0   No current facility-administered medications for this visit.   No Known Allergies   Review of Systems: All systems reviewed and negative except where noted in HPI.    Lab Results  Component Value Date   WBC 8.5 04/04/2020   HGB 13.3 04/04/2020   HCT 38.7 (L) 04/04/2020   MCV 89.6 04/04/2020   PLT 241.0 04/04/2020    Lab Results  Component Value Date   CREATININE 1.29 04/04/2020   BUN 25 (H) 04/04/2020   NA 139 04/04/2020   K 4.6 04/04/2020   CL 104 04/04/2020   CO2 30 04/04/2020     Lab Results  Component Value Date   ALT 17 04/04/2020   AST 18 04/04/2020   ALKPHOS 47 04/04/2020   BILITOT 0.4 04/04/2020    Physical Exam: BP (!) 130/60   Pulse 78   Ht 6' 4"  (1.93 m)   Wt 201 lb (91.2 kg)   BMI 24.47 kg/m  Constitutional: Pleasant,well-developed, male in no acute distress. HEENT: Normocephalic and atraumatic. Conjunctivae are normal. No scleral icterus. Neck supple.  Cardiovascular: Normal rate, regular rhythm.  Pulmonary/chest: Effort normal and breath sounds normal.  Abdominal: Soft, nondistended, nontender. . There are no masses palpable.  Extremities: no edema Lymphadenopathy: No cervical adenopathy noted. Neurological: Alert and oriented to person place and time. Skin: Skin is warm and dry. No rashes noted. Psychiatric: Normal mood and affect. Behavior is normal.   ASSESSMENT AND PLAN: 74 year old male here for new patient assessment the following:  GERD / dysphagia - longstanding use of omeprazole for years to control symptoms of waterbrash which works pretty well at baseline.  He was in the emergency room for some midline chest discomfort after eating a heavy meal late one night in May, negative cardiac work-up, high clinical suspicion for reflux.  He has  been watching his diet and has had no recurrence, feels well in regards to reflux symptoms, but does have some new solid food dysphagia in recent months.  Discussed options with him.  Given his longstanding use of PPI, age, ethnicity, he does meet criteria for screening endoscopy to assess for Barrett's esophagus.  I would also recommend endoscopy to evaluate his dysphagia and potentially dilate stricture etc. If one is present and amenable.  I discussed risk and benefits of endoscopy and anesthesia and he wanted to proceed.  He does  have mild CKD, if he has no evidence of Barrett's we may want to switch him from PPI to Pepcid and see if that would help control his symptoms given potential renal side effects of longstanding PPI use.  He agreed with the plan, all questions answered, further recommendations pending results of this exam.  Crump Cellar, MD [facility]  CC: Marin Olp, MD

## 2020-05-30 NOTE — Patient Instructions (Addendum)
If you are age 73 or older, your body mass index should be between 23-30. Your Body mass index is 24.47 kg/m. If this is out of the aforementioned range listed, please consider follow up with your Primary Care Provider.  If you are age 31 or younger, your body mass index should be between 19-25. Your Body mass index is 24.47 kg/m. If this is out of the aformentioned range listed, please consider follow up with your Primary Care Provider.   You have been scheduled for an endoscopy. Please follow written instructions given to you at your visit today. If you use inhalers (even only as needed), please bring them with you on the day of your procedure.   Thank you for entrusting me with your care and for choosing [facility], Dr. Ileene Patrick

## 2020-06-01 ENCOUNTER — Other Ambulatory Visit: Payer: Self-pay

## 2020-06-01 ENCOUNTER — Encounter: Payer: Self-pay | Admitting: Gastroenterology

## 2020-06-01 ENCOUNTER — Ambulatory Visit (AMBULATORY_SURGERY_CENTER): Payer: Medicare Other | Admitting: Gastroenterology

## 2020-06-01 VITALS — BP 126/71 | HR 59 | Temp 97.3°F | Resp 8 | Ht 76.0 in | Wt 201.0 lb

## 2020-06-01 DIAGNOSIS — K317 Polyp of stomach and duodenum: Secondary | ICD-10-CM | POA: Diagnosis not present

## 2020-06-01 DIAGNOSIS — R1310 Dysphagia, unspecified: Secondary | ICD-10-CM | POA: Diagnosis not present

## 2020-06-01 DIAGNOSIS — K2970 Gastritis, unspecified, without bleeding: Secondary | ICD-10-CM

## 2020-06-01 DIAGNOSIS — K219 Gastro-esophageal reflux disease without esophagitis: Secondary | ICD-10-CM | POA: Diagnosis not present

## 2020-06-01 DIAGNOSIS — K319 Disease of stomach and duodenum, unspecified: Secondary | ICD-10-CM | POA: Diagnosis not present

## 2020-06-01 DIAGNOSIS — K3189 Other diseases of stomach and duodenum: Secondary | ICD-10-CM

## 2020-06-01 MED ORDER — SODIUM CHLORIDE 0.9 % IV SOLN: 500.0000 mL | Freq: Once | INTRAVENOUS | Status: DC

## 2020-06-01 MED ORDER — OMEPRAZOLE 20 MG PO CPDR: 20.0000 mg | DELAYED_RELEASE_CAPSULE | Freq: Two times a day (BID) | ORAL | 0 refills | Status: AC

## 2020-06-01 NOTE — Patient Instructions (Signed)
The biopsies taken today have been sent for pathology.  The results can take 1-3 weeks to receive.    You may resume your previous diet and medication schedule.   A prescription for Omeprazole 20 mg has been sent to your pharmacy.  You will take this twice a day (before breakfast and before dinner) for 30 days then go back to just once a day.  Thank you for allowing [place] to care for you today!!!   YOU HAD AN ENDOSCOPIC PROCEDURE TODAY AT THE [facility]:   Refer to the procedure report that was given to you for any specific questions about what was found during the examination.  If the procedure report does not answer your questions, please call your gastroenterologist to clarify.  If you requested that your care partner not be given the details of your procedure findings, then the procedure report has been included in a sealed envelope for you to review at your convenience later.  YOU SHOULD EXPECT: Some feelings of bloating in the abdomen. Passage of more gas than usual.  Walking can help get rid of the air that was put into your GI tract during the procedure and reduce the bloating.  Please Note:  You might notice some irritation and congestion in your nose or some drainage.  This is from the oxygen used during your procedure.  There is no need for concern and it should clear up in a day or so.  SYMPTOMS TO REPORT IMMEDIATELY:   Following upper endoscopy (EGD)  Vomiting of blood or coffee ground material  New chest pain or pain under the shoulder blades  Painful or persistently difficult swallowing  New shortness of breath  Fever of 100F or higher  Black, tarry-looking stools  For urgent or emergent issues, a gastroenterologist can be reached at any hour by calling [phone]. Do not use MyChart messaging for urgent concerns.    DIET:  We do recommend a small meal at first, but then you may proceed to your regular diet.  Drink plenty of fluids but you should avoid  alcoholic beverages for 24 hours.  ACTIVITY:  You should plan to take it easy for the rest of today and you should NOT DRIVE or use heavy machinery until tomorrow (because of the sedation medicines used during the test).    FOLLOW UP: Our staff will call the number listed on your records 48-72 hours following your procedure to check on you and address any questions or concerns that you may have regarding the information given to you following your procedure. If we do not reach you, we will leave a message.  We will attempt to reach you two times.  During this call, we will ask if you have developed any symptoms of COVID 19. If you develop any symptoms (ie: fever, flu-like symptoms, shortness of breath, cough etc.) before then, please call [phone].  If you test positive for Covid 19 in the 2 weeks post procedure, please call and report this information to [place].    If any biopsies were taken you will be contacted by phone or by letter within the next 1-3 weeks.  Please call us at [phone] if you have not heard about the biopsies in 3 weeks.    SIGNATURES/CONFIDENTIALITY: You and/or your care partner have signed paperwork which will be entered into your electronic medical record.  These signatures attest to the fact that that the information above on your After Visit Summary has been reviewed  and is understood.  Full responsibility of the confidentiality of this discharge information lies with you and/or your care-partner. 

## 2020-06-01 NOTE — Progress Notes (Signed)
Called to room to assist during endoscopic procedure.  Patient ID and intended procedure confirmed with present staff. Received instructions for my participation in the procedure from the performing physician.  

## 2020-06-01 NOTE — Progress Notes (Signed)
To PACU, VSS. Report to RN.tb 

## 2020-06-01 NOTE — Op Note (Signed)
[facility] Patient Name: [name] Procedure Date: 06/01/2020 3:09 PM MRN: [id number] Endoscopist: Viviann Spare P. Adela Lank , MD Age: 74 Referring MD:  Date of Birth: 06-Sep-1946 Gender: Male Account #: [id number] Procedure:                Upper GI endoscopy Indications:              gastro-esophageal reflux disease - rule out                            Barrett's, history of mild intermittent dysphagia,                            longstanding omeprazole use Medicines:                Monitored Anesthesia Care Procedure:                Pre-Anesthesia Assessment:                           - Prior to the procedure, a History and Physical                            was performed, and patient medications and                            allergies were reviewed. The patient's tolerance of                            previous anesthesia was also reviewed. The risks                            and benefits of the procedure and the sedation                            options and risks were discussed with the patient.                            All questions were answered, and informed consent                            was obtained. Prior Anticoagulants: The patient has                            taken no previous anticoagulant or antiplatelet                            agents. ASA Grade Assessment: II - A patient with                            mild systemic disease. After reviewing the risks                            and benefits, the patient was deemed in  satisfactory condition to undergo the procedure.                           After obtaining informed consent, the endoscope was                            passed under direct vision. Throughout the                            procedure, the patient's blood pressure, pulse, and                            oxygen saturations were monitored continuously. The                            Endoscope was introduced  through the mouth, and                            advanced to the second part of duodenum. The upper                            GI endoscopy was accomplished without difficulty.                            The patient tolerated the procedure well. Scope In: Scope Out: Findings:                 Esophagogastric landmarks were identified: the                            Z-line was found at 42 cm, the gastroesophageal                            junction was found at 42 cm and the upper extent of                            the gastric folds was found at 42 cm from the                            incisors.                           The exam of the esophagus was otherwise normal. No                            stenosis / stricture / Barrett's appreciated.                           Multiple small sessile polyps were found in the                            gastric fundus and in the gastric body, benign  appearing. One representative polyp was removed                            with a cold biopsy forceps. Resection and retrieval                            were complete.                           Patchy inflammation characterized by erosions,                            erythema and friability was found in the gastric                            antrum.                           The exam of the stomach was otherwise normal.                           Biopsies were taken with a cold forceps in the                            gastric body, at the incisura and in the gastric                            antrum for Helicobacter pylori testing.                           The duodenal bulb and second portion of the                            duodenum were normal. Complications:            No immediate complications. Estimated blood loss:                            Minimal. Estimated Blood Loss:     Estimated blood loss was minimal. Impression:               - Esophagogastric landmarks  identified.                           - Normal esophagus otherwise                           - Multiple gastric polyps, suspect benign fundic                            gland polyps. Representative sample resected and                            retrieved.                           - Antral Gastritis.                           -  Normal stomach otherwise - biopsied to rule out H                            pylori                           - Normal duodenal bulb and second portion of the                            duodenum. Recommendation:           - Patient has a contact number available for                            emergencies. The signs and symptoms of potential                            delayed complications were discussed with the                            patient. Return to normal activities tomorrow.                            Written discharge instructions were provided to the                            patient.                           - Resume previous diet.                           - Continue present medications.                           - Increase omeprazole to twice daily for 30 days                            given gastritis noted on this exam                           - Minimize NSAID use                           - Await pathology results. Viviann Spare P. Nalia Honeycutt, MD 06/01/2020 3:42:33 PM This report has been signed electronically.

## 2020-06-02 ENCOUNTER — Other Ambulatory Visit: Payer: Self-pay

## 2020-06-02 ENCOUNTER — Telehealth: Payer: Self-pay

## 2020-06-02 NOTE — Telephone Encounter (Signed)
Submitted a PA for omeprazole 20 mg BID via CoverMyMeds.

## 2020-06-04 NOTE — Telephone Encounter (Signed)
PA approved for omeprazole 20 mg BID via CoverMyMeds. [facility]  03/04/2020 - 11/03/2020. Customer Care at [phone], Member ID Number: [id number]

## 2020-06-05 ENCOUNTER — Telehealth: Payer: Self-pay

## 2020-06-05 NOTE — Telephone Encounter (Signed)
No answer, left message to call back later today, B.Alieyah Spader RN. 

## 2020-06-05 NOTE — Telephone Encounter (Signed)
Patient returned the call is doing well no questions at this time 

## 2020-06-22 ENCOUNTER — Encounter: Payer: Self-pay | Admitting: Family Medicine

## 2020-06-22 ENCOUNTER — Other Ambulatory Visit: Payer: Self-pay

## 2020-06-22 MED ORDER — FEBUXOSTAT 40 MG PO TABS: 40.0000 mg | ORAL_TABLET | Freq: Every day | ORAL | 3 refills | Status: AC

## 2020-08-25 ENCOUNTER — Other Ambulatory Visit: Payer: Self-pay | Admitting: Family Medicine

## 2020-11-28 ENCOUNTER — Telehealth: Payer: Self-pay | Admitting: Family Medicine

## 2020-11-28 NOTE — Telephone Encounter (Signed)
Left message for patient to call back and schedule Medicare Annual Wellness Visit (AWV) either virtually or in office.   Last AWV 12/18/2018 please schedule at anytime with    This should be a 45 minute visit.

## 2021-04-11 LAB — TSH: TSH: 4.62 (ref 0.41–5.90)

## 2021-04-11 LAB — VITAMIN D 25 HYDROXY (VIT D DEFICIENCY, FRACTURES): Vit D, 25-Hydroxy: 25

## 2021-04-11 LAB — LIPID PANEL
Cholesterol: 123 (ref 0–200)
HDL: 48 (ref 35–70)
LDL Cholesterol: 61

## 2021-04-11 LAB — BASIC METABOLIC PANEL
BUN: 26 — AB (ref 4–21)
Potassium: 4.7 (ref 3.4–5.3)

## 2021-04-11 LAB — CBC: RBC: 4.54 (ref 3.87–5.11)

## 2021-04-11 LAB — HEPATIC FUNCTION PANEL: AST: 22 (ref 14–40)

## 2021-04-11 LAB — HEMOGLOBIN A1C: Hemoglobin A1C: 5.5

## 2021-04-11 LAB — CBC AND DIFFERENTIAL
HCT: 42 (ref 41–53)
Hemoglobin: 13.4 — AB (ref 13.5–17.5)
Platelets: 218 (ref 150–399)
WBC: 6.9

## 2021-05-16 ENCOUNTER — Encounter: Payer: Self-pay | Admitting: Family Medicine

## 2022-07-29 ENCOUNTER — Encounter: Payer: Self-pay | Admitting: *Deleted

## 2022-10-08 ENCOUNTER — Encounter: Payer: Self-pay | Admitting: Family Medicine

## 2022-10-17 ENCOUNTER — Encounter: Payer: Self-pay | Admitting: *Deleted
